# Patient Record
Sex: Male | Born: 1960
Health system: Southern US, Community
[De-identification: ages and names within clinical notes are randomized; demographics above are authoritative.]

## PROBLEM LIST (undated history)

## (undated) DIAGNOSIS — R0600 Dyspnea, unspecified: Secondary | ICD-10-CM

## (undated) DIAGNOSIS — R011 Cardiac murmur, unspecified: Secondary | ICD-10-CM

## (undated) DIAGNOSIS — F419 Anxiety disorder, unspecified: Secondary | ICD-10-CM

## (undated) DIAGNOSIS — R61 Generalized hyperhidrosis: Secondary | ICD-10-CM

## (undated) DIAGNOSIS — F32A Depression, unspecified: Secondary | ICD-10-CM

## (undated) DIAGNOSIS — Z87442 Personal history of urinary calculi: Secondary | ICD-10-CM

## (undated) DIAGNOSIS — T8859XA Other complications of anesthesia, initial encounter: Secondary | ICD-10-CM

## (undated) DIAGNOSIS — D649 Anemia, unspecified: Secondary | ICD-10-CM

## (undated) DIAGNOSIS — K219 Gastro-esophageal reflux disease without esophagitis: Secondary | ICD-10-CM

## (undated) DIAGNOSIS — M1990 Unspecified osteoarthritis, unspecified site: Secondary | ICD-10-CM

## (undated) DIAGNOSIS — E7800 Pure hypercholesterolemia, unspecified: Secondary | ICD-10-CM

## (undated) DIAGNOSIS — N200 Calculus of kidney: Secondary | ICD-10-CM

## (undated) DIAGNOSIS — I10 Essential (primary) hypertension: Secondary | ICD-10-CM

## (undated) DIAGNOSIS — F988 Other specified behavioral and emotional disorders with onset usually occurring in childhood and adolescence: Secondary | ICD-10-CM

## (undated) HISTORY — DX: Other specified behavioral and emotional disorders with onset usually occurring in childhood and adolescence: F98.8

## (undated) HISTORY — PX: KIDNEY STONE SURGERY: SHX686

## (undated) HISTORY — PX: HERNIA REPAIR: SHX51

## (undated) HISTORY — PX: REPLACEMENT TOTAL KNEE: SUR1224

## (undated) HISTORY — DX: Generalized hyperhidrosis: R61

## (undated) HISTORY — PX: BILATERAL CARPAL TUNNEL RELEASE: SHX6508

## (undated) HISTORY — PX: SPINE SURGERY: SHX786

## (undated) HISTORY — PX: BACK SURGERY: SHX140

## (undated) HISTORY — PX: MEDIAL PARTIAL KNEE REPLACEMENT: SHX5965

## (undated) HISTORY — PX: LUMBAR FUSION: SHX111

## (undated) HISTORY — DX: Calculus of kidney: N20.0

## (undated) HISTORY — PX: KNEE SURGERY: SHX244

## (undated) HISTORY — DX: Essential (primary) hypertension: I10

## (undated) HISTORY — DX: Pure hypercholesterolemia, unspecified: E78.00

---

## 1998-03-13 ENCOUNTER — Inpatient Hospital Stay (HOSPITAL_COMMUNITY): Admission: RE | Admit: 1998-03-13 | Discharge: 1998-03-19 | Payer: Self-pay | Admitting: Orthopedic Surgery

## 1998-03-21 ENCOUNTER — Other Ambulatory Visit: Admission: RE | Admit: 1998-03-21 | Discharge: 1998-03-21 | Payer: Self-pay

## 2007-10-05 ENCOUNTER — Inpatient Hospital Stay (HOSPITAL_COMMUNITY): Admission: RE | Admit: 2007-10-05 | Discharge: 2007-10-09 | Payer: Self-pay | Admitting: Orthopedic Surgery

## 2007-12-24 ENCOUNTER — Encounter: Admission: RE | Admit: 2007-12-24 | Discharge: 2007-12-24 | Payer: Self-pay | Admitting: Neurosurgery

## 2009-04-05 IMAGING — CR DG CHEST 2V
2 series · 2 of 2 positions shown · non-contrast
Comparison: None.

CLINICAL DATA: Preop for knee surgery.
 CHEST - 2 VIEW:

[w chest pa *]
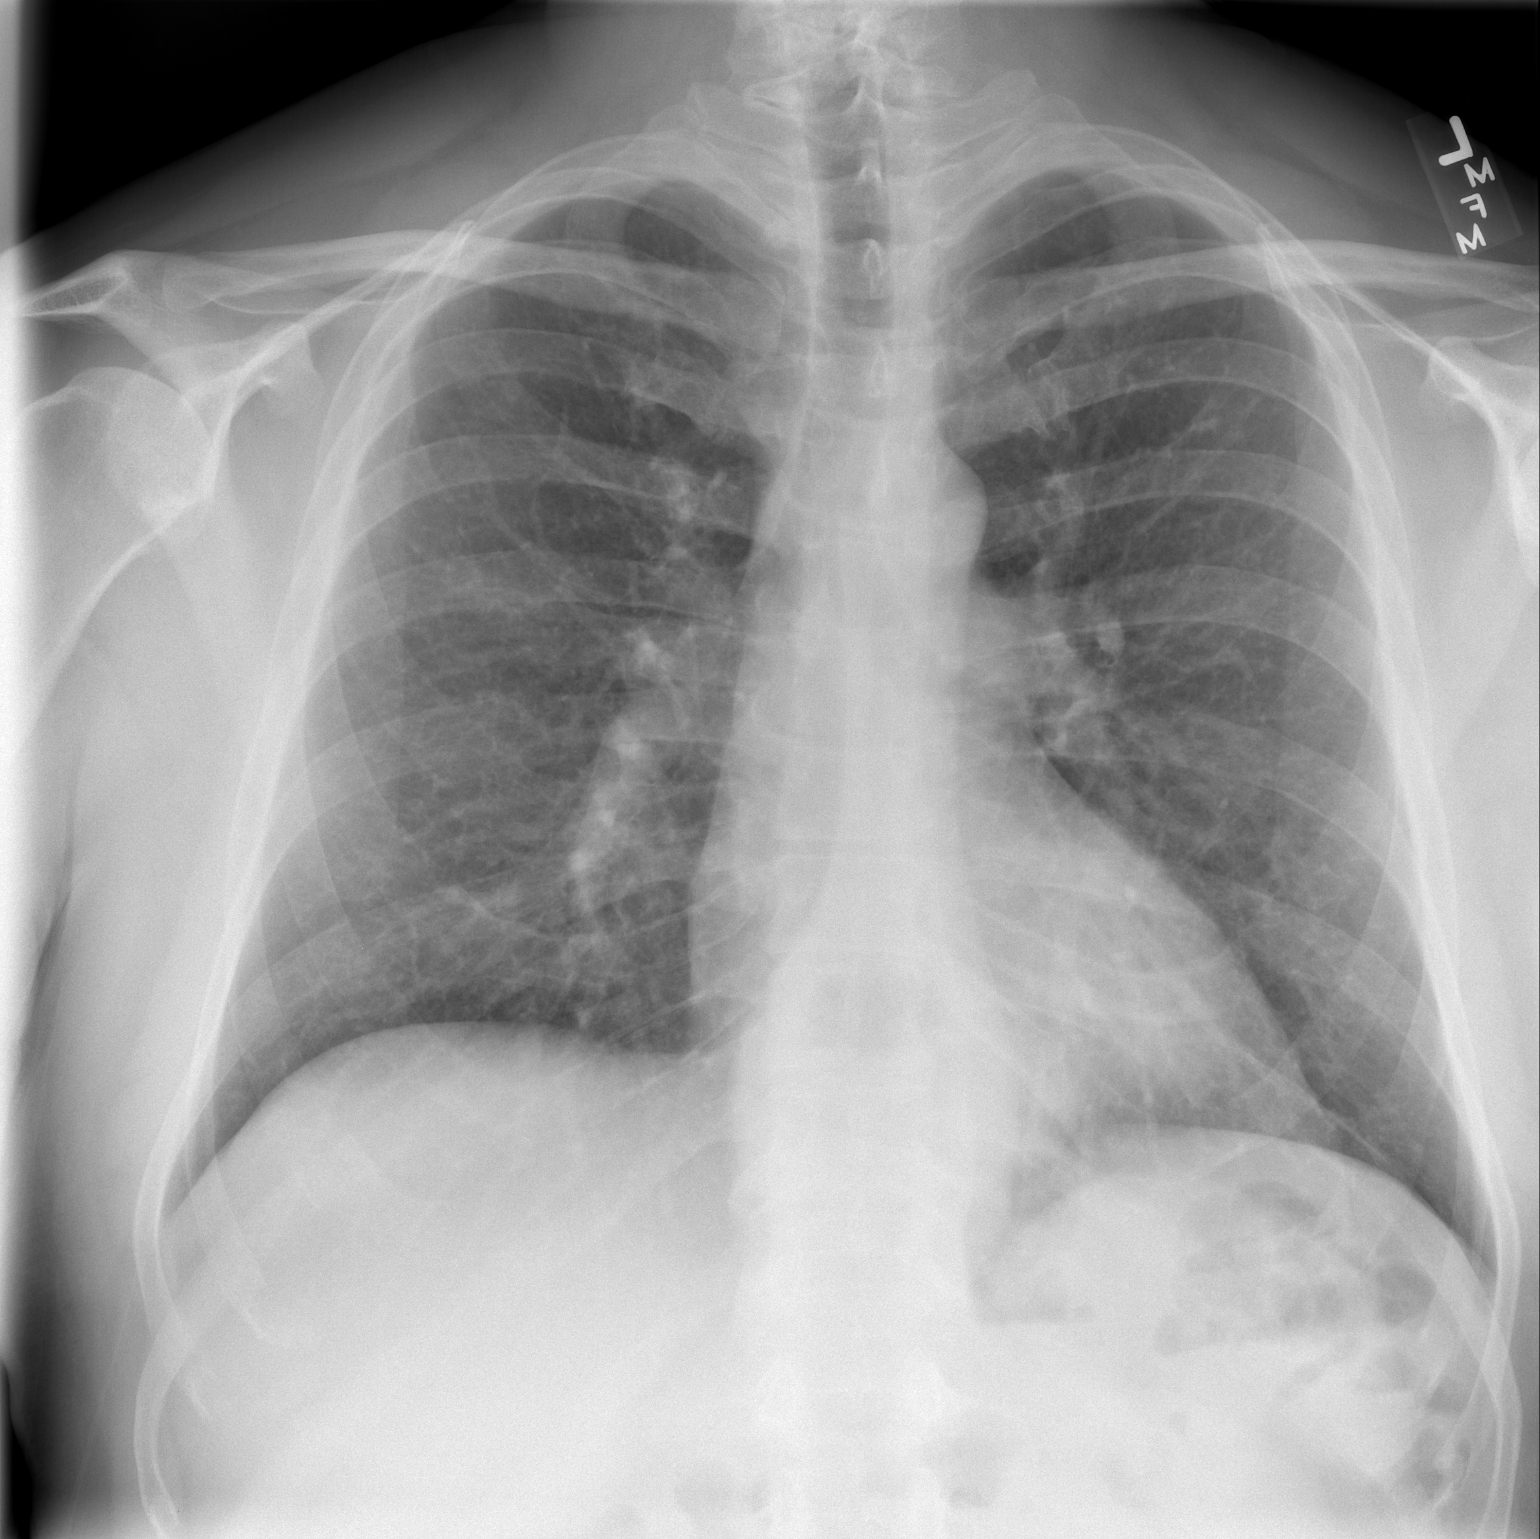

[w chest lat *]
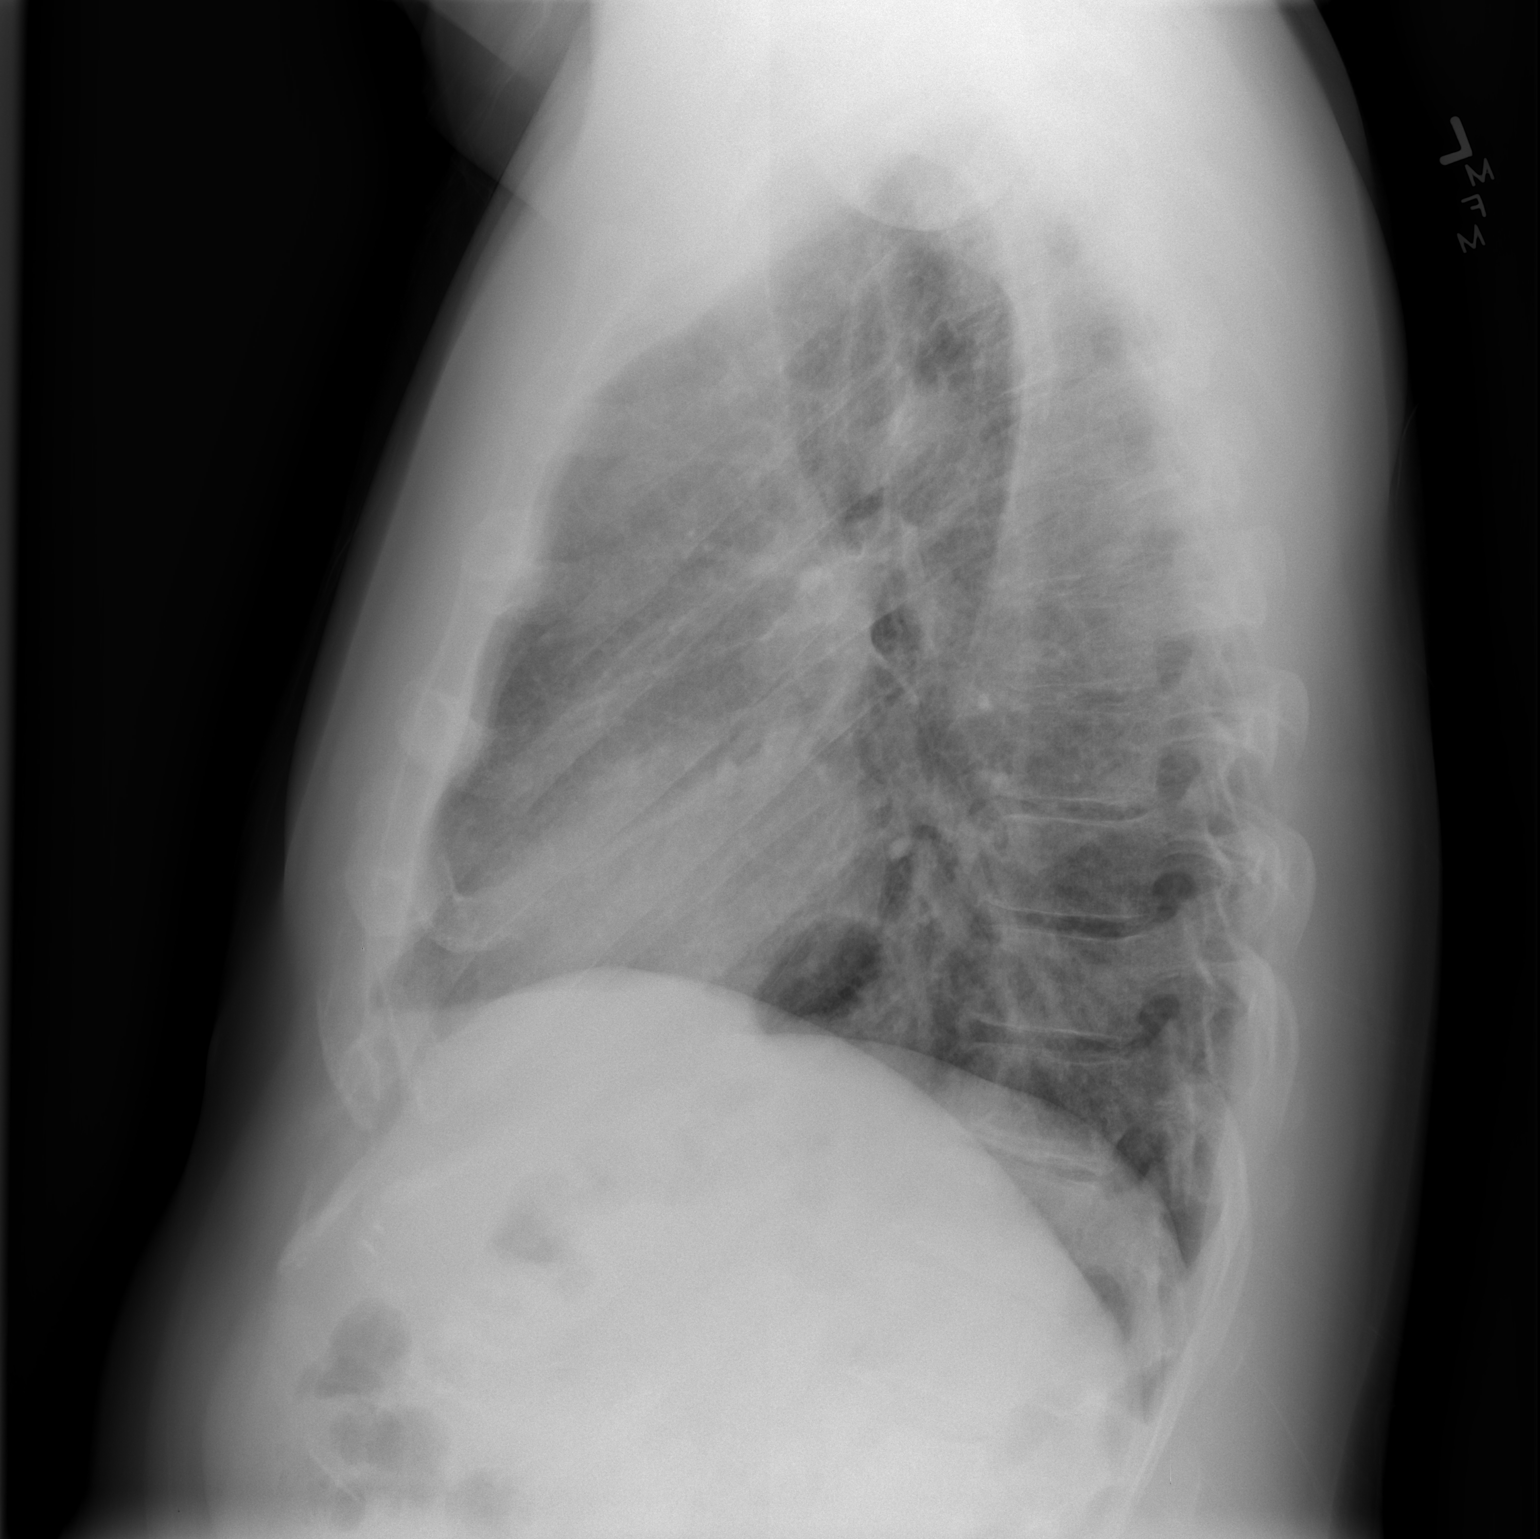

[2 of 2 positions shown; findings below may reference images not displayed]

FINDINGS: Heart and mediastinal contours normal.  There is peribronchial thickening without active airspace disease.  A moderate sized hiatal hernia is noted.
IMPRESSION: Peribronchial thickening and hiatal hernia ? no active disease.

## 2009-05-03 ENCOUNTER — Ambulatory Visit: Payer: Self-pay | Admitting: Vascular Surgery

## 2010-10-19 ENCOUNTER — Encounter (HOSPITAL_COMMUNITY)
Admission: RE | Admit: 2010-10-19 | Discharge: 2010-10-19 | Disposition: A | Discharge status: Home / Self Care | Payer: Medicare Other | Source: Ambulatory Visit | Attending: Neurosurgery | Admitting: Neurosurgery

## 2010-10-19 DIAGNOSIS — Z01810 Encounter for preprocedural cardiovascular examination: Secondary | ICD-10-CM | POA: Insufficient documentation

## 2010-10-19 DIAGNOSIS — Z01812 Encounter for preprocedural laboratory examination: Secondary | ICD-10-CM | POA: Insufficient documentation

## 2010-10-19 LAB — SURGICAL PCR SCREEN: MRSA, PCR: NEGATIVE

## 2010-10-19 LAB — CBC
HCT: 45.1 % (ref 39.0–52.0)
Hemoglobin: 15.3 g/dL (ref 13.0–17.0)
MCH: 32.6 pg (ref 26.0–34.0)
MCHC: 33.9 g/dL (ref 30.0–36.0)
MCV: 96 fL (ref 78.0–100.0)
Platelets: 280 10*3/uL (ref 150–400)
RBC: 4.7 MIL/uL (ref 4.22–5.81)
RDW: 12.8 % (ref 11.5–15.5)
WBC: 7.7 10*3/uL (ref 4.0–10.5)

## 2010-10-25 ENCOUNTER — Inpatient Hospital Stay (HOSPITAL_COMMUNITY): Payer: Medicare Other

## 2010-10-25 ENCOUNTER — Inpatient Hospital Stay (HOSPITAL_COMMUNITY)
Admission: RE | Admit: 2010-10-25 | Discharge: 2010-10-31 | DRG: 460 | Disposition: A | Discharge status: Home Health | Payer: Medicare Other | Source: Ambulatory Visit | Attending: Neurosurgery | Admitting: Neurosurgery

## 2010-10-25 DIAGNOSIS — K5900 Constipation, unspecified: Secondary | ICD-10-CM | POA: Diagnosis present

## 2010-10-25 DIAGNOSIS — Z96659 Presence of unspecified artificial knee joint: Secondary | ICD-10-CM

## 2010-10-25 DIAGNOSIS — M47817 Spondylosis without myelopathy or radiculopathy, lumbosacral region: Principal | ICD-10-CM | POA: Diagnosis present

## 2010-10-25 DIAGNOSIS — M25569 Pain in unspecified knee: Secondary | ICD-10-CM | POA: Diagnosis present

## 2010-10-25 DIAGNOSIS — G894 Chronic pain syndrome: Secondary | ICD-10-CM | POA: Diagnosis present

## 2010-10-25 LAB — ABO/RH: ABO/RH(D): O NEG

## 2010-10-25 LAB — TYPE AND SCREEN
ABO/RH(D): O NEG
Antibody Screen: NEGATIVE

## 2010-11-01 NOTE — Op Note (Signed)
NAMEJAZZIEL, Charles Lara                ACCOUNT NO.:  [id number]  MEDICAL RECORD NO.:  [id number]           PATIENT TYPE:  I  LOCATION:  3036                         FACILITY:  [facility]  PHYSICIAN:  Hewitt Shorts, M.D.DATE OF BIRTH:  1960/09/27  DATE OF PROCEDURE:  10/25/2010 DATE OF DISCHARGE:                              OPERATIVE REPORT   PREOPERATIVE DIAGNOSES: 1. Grade 2 dynamic degenerative spondylosis at L4-5. 2. Severe multifactorial lumbar stenosis at L4-5. 3. Advanced degenerative disk disease and spondylosis at L4-5 and L5-     S1.  POSTOPERATIVE DIAGNOSES: 1. Grade 2 dynamic degenerative spondylosis at L4-5. 2. Severe multifactorial lumbar stenosis at L4-5. 3. Advanced degenerative disk disease and spondylosis at L4-5 and L5-     S1.  PROCEDURES: 1. Bilateral L4-5 and L5-S1 lumbar decompression including bilateral     laminotomy, facetectomy, foraminotomy with decompression of the     exiting L4, L5, and S1 nerve roots bilaterally with decompression     beyond that required for interbody arthrodesis. 2. Bilateral L4-5 and L5-S1 posterior lumbar interbody arthrodesis     with AVS PEEK interbody implants infuse and Vitoss with bone marrow     aspirate. 3. Bilateral L4-S1 posterolateral arthrodesis with posterior     instrumentation infuse and Vitoss with bone marrow aspirate.  SURGEON:  Hewitt Shorts, MD  ASSISTANT:  Cristi Loron, MD  ANESTHESIA:  General endotracheal.  INDICATIONS:  The patient is a 50 year old man, who presented with low back pain extending from the buttocks and thighs bilaterally, who has advanced degenerative change in the lumbar spine.  MRI from 2012 as compared to 2009 showed progression of the spondylolisthesis, worsening of the stenosis, and worsening of the overall spondylotic degeneration. Decision was made to proceed with two-level decompression arthrodesis.  PROCEDURE IN DETAIL:  The patient was brought to the operating  room, placed on general endotracheal anesthesia.  The patient was turned to prone position.  Lumbar region was prepped with Betadine soap and solution, draped in sterile fashion.  The midline was infiltrated with local anesthetic with epinephrine.  An x-ray taken of the L4-S1 level and identified the midline.  Incision was made and carried down through the subcutaneous tissue.  Bipolar electrocautery was used to maintain hemostasis.  Dissection was carried down to the lumbar fascia, which was incised bilaterally and the paraspinal muscles were dissected from the spinous process and lamina in subperiosteal fashion.  A self-retaining retractor was placed and x-ray taken, and L4-5 and L5-S1 interlaminar spaces were identified.  Then using magnification and microdissection microsurgical technique, we proceeded with the decompression.  There was hypertrophic facet arthropathy bilaterally L4-5 worse than the L5-S1. We proceeded with bilateral laminotomies and facetectomies at both the L4-5 and L5-S1 levels using the high-speed drill and double-action rongeurs and Kerrison punches.  The ligamentum flavum was carefully removed at each laminotomy and facetectomy site and foraminotomies performed bilaterally so as to decompress the exiting L4, L5, and S1 nerve roots bilaterally.  Once the decompression was completed, we identified the annulus.  The listhesis at L4-5 was noted and then proceeded with a bilateral  diskectomy at each level.  The disk space at L5-S1 was severely narrowed.  The overlying annulus and bulging disk was removed and then we gradually entered into the disk space and proceeded with diskectomy removing the small amount of remaining degenerative disk material using micro curettes and pituitary rongeurs.  We then began to prepare the endplates of the vertebral bodies for interbody arthrodesis using paddle curettes to remove the cartilaginous surface down to a good bony  surface.  Similarly, the L4-5 diskectomy was performed using micro curettes and pituitary rongeurs.  Thorough diskectomy was performed medially and laterally and some of the heaped-up spondylotic disk protrusion was removed as well so as to decompress the ventral aspect of the thecal sac.  We again prepared the endplates using paddle curettes removing the cartilaginous endplate surfaces down to a good bony surface.  We then measured the height of the intervertebral disk space and selected 7-mm implants for the L5-S1 level and 9-mm implants for the L4- 5 level.  A C-arm fluoroscope was then draped and brought in to the field to provide guidance and probing of the pedicles.  I first probed the left L5 pedicle.  Bone marrow was aspirated from the vertebral body.  I injected over 10 mL strips of Vitoss.  We then packed each of the implants with combination of Vitoss infuse with the infuse being sandwiching the middle of the implants.  We then first placed the implants on the L5-S1 level, placing the first implant on the patient's right side.  The thecal sac and nerve root was retracted medially.  Once that implant was in place, we then packed the midline of the disk space with infuse and Vitoss, bone marrow aspirate, then placed a second implant on the left side again and retracted thecal sac and nerve root medially.  Once both implants were countersunk, we then proceeded to the L4-5 level and again the first implant was placed on the right side.  We then packed the midline with combination of infuse and Vitoss bone marrow aspirate and placed a second implant on the left side using a similar technique as at the L5-S1 level.  We then packed additional Vitoss bone marrow aspirate lateral to the implants at the L4-5 level in the lateral aspect of the disk space.  We then proceeded with identifying pedicle entry point on the right- sided L5.  Both pedicles were probed, tapped, examined with  a ball probe.  Good threading was noted.  No bony cutouts were found and then we placed 5.75 x 45 mm screws bilaterally.  Similarly, using C-arm fluoroscopic guidance, pedicle entry points were identified in the L4 bilaterally.  Each of the pedicles was probed, tapped, examined the ball probe.  Good bony surfaces noted.  No cutouts were found.  We placed 5.75 x 45 mm screws bilaterally and again at S1, each of the pedicle entry points were identified.  Each of the pedicles were probed, tapped, examined with the ball probe with good bony surfaces noted and then we placed a 5.75 x 40 mm screws bilaterally.  Once all six screws were in place, we selected two 60-mm pre-lordosed rods.  They were placed within the screw heads and then secured with locking caps.  Once all 6 locking caps were placed, final tightening was performed against a counter torque.  We previously decorticated the hypertrophic facet surfaces at L4-5 and L5-S1 bilaterally with the high-speed drill.  We packed a combination of infuse and Vitoss bone marrow  aspirate across the facet complexes bilaterally at L4-5 and L5-S1.  The wound was irrigated numerous times through the procedure, flushed with saline, subsequently with bacitracin solution.  Good hemostasis was established with use of bipolar electrocautery and Gelfoam with thrombin, and then once the decompression was completed, the arthrodesis completed, and hemostasis was established, we proceeded with closure. Deep fascia was closed with interrupted undyed 1 Vicryl sutures, Scarpa fascia with interrupted undyed 1 Vicryl sutures.  Subcutaneous and Subcuticular were closed with interrupted, inverted 2-0 undyed Vicryl sutures.  Skin was closed with surgical staples.  The wound was dressed with Adaptic, sterile gauze, and 4-inch Hypafix.  The procedure was tolerated well.  The estimated blood loss was 800 mL.  We did use a Cell Saver and we were able to return 320 mL  of Cell Saver blood to the patient.  Sponge and needle count were correct.  Following surgery, the patient was turned back to supine position, reversed from the anesthetic, extubated, and transferred to the recovery room for further care where he was noted to be moving all four extremities to command.     Hewitt Shorts, M.D.     RWN/MEDQ  D:  10/25/2010  T:  10/26/2010  Job:  [id number]  Electronically Signed by Shirlean Kelly M.D. on 11/01/2010 07:58:23 AM

## 2010-11-01 NOTE — Discharge Summary (Signed)
  NAMEZYMIRE, TURNBO NO.:  [id number]  MEDICAL RECORD NO.:  [id number]           PATIENT TYPE:  I  LOCATION:  3036                         FACILITY:  [facility]  PHYSICIAN:  Hewitt Shorts, M.D.DATE OF BIRTH:  July 20, 1961  DATE OF ADMISSION:  10/25/2010 DATE OF DISCHARGE:  10/31/2010                              DISCHARGE SUMMARY   ADMISSION HISTORY AND PHYSICAL EXAMINATION:  The patient is a 50 year old man who presented with low back pain extending into the buttocks bilaterally, left worse than right.  He had chronic pain for 7-10 years treated with numerous medications and spinal injections.  X-ray showed a dynamic degenerative grade 2 spondylolisthesis at L4-5 with disk space narrowing, L5-S1 worse than L4-5.  MRI reconfirmed the degenerative spondylolisthesis, which appeared worse than his previous MRI from 3 years earlier.  There was severe stenosis at the L4-5 level.  Previous surgery included a number of knee surgeries as well as umbilical herniorrhaphy.  General examination showed temperature 98.2, pulse of 80, blood pressure 143/86.  Lungs were clear.  Heart was unremarkable. Neurologic examination showed weakness of the left evertor.  Further details of the admission history and physical examination included in my admission dictation.  HOSPITAL COURSE:  The patient was admitted, underwent an L4-5 and L5-S1 lumbar decompression plus PLA.  He had a lot of difficulty with postoperative pain because of long term chronic narcotics prescribed by his orthopedist and pain management physician.  Physical therapy and occupational therapy consultations were requested and gradually over the postoperative days, he has become more comfortable.  We have adjusted his medications.  Currently, he continues on his morphine 60 mg p.o. three times a day as well as Percocet 1 or 2 tablets q.4 h. p.r.n. pain. He also is using Robaxin 750 mg twice a day and that seems to  be the most effective muscle relaxant for him.  He has been able to steadily increase his mobility, ability to transfer, ambulate in the hallways, and his wound is healing well and his staples were removed yesterday. There is no erythema, swelling, or drainage.  He is to return for followup in my office in about 3-4 weeks with AP and lateral lumbar spine x-rays.  He is to be walking daily out in the fresh air.  He is to be wearing his lumbar brace whenever he is up and about.  Home health, PT, and OT have been ordered, three in one has been requested. Discharge prescriptions were given for Percocet 1 or 2 tablets p.o. q.4- 6 h. p.r.n. pain, 100 tablets, no refills and Robaxin 750 mg b.i.d. p.r.n. muscle spasms, 60 tablets, 1 refill.  DISCHARGE DIAGNOSES:  Lumbar stenosis, lumbar spondylolisthesis, lumbar spondylosis, lumbar degenerative disk disease.     Hewitt Shorts, M.D.     RWN/MEDQ  D:  10/31/2010  T:  11/01/2010  Job:  [id number]  Electronically Signed by Shirlean Kelly M.D. on 11/01/2010 07:58:30 AM

## 2010-11-01 NOTE — Op Note (Signed)
Charles Lara, Charles Lara NO.:  [id number]  MEDICAL RECORD NO.:  [id number]           PATIENT TYPE:  I  LOCATION:  3172                         FACILITY:  [facility]  PHYSICIAN:  Hewitt Shorts, M.D.DATE OF BIRTH:  Mar 16, 1961  DATE OF PROCEDURE: DATE OF DISCHARGE:                              OPERATIVE REPORT   HISTORY OF PRESENT ILLNESS:  The patient is a 50 year old right-handed white male who was evaluated for low back pain extending down to the upper buttocks bilaterally, left worse than right.  He has had chronic pain for 7-10 years.  He has been treated with NSAIDS, muscle relaxants, and spinal injections, none of which has given him lasting relief.  Dr. Sheran Luz has been managing his chronic pain with extensive medication management.  He also has chronic degeneration of his knees bilaterally with chronic bilateral knee pain.  He has had number of previous knee surgeries as described below.  The patient has been studied with x-rays and MRI scan.  X-rays show a dynamic degenerative grade 2 spondylolisthesis of L4 and L5 and disk space down to L5-S1 worse than L4-5.  MRI scan reconfirmed the degenerative spondylolisthesis and disk space narrowing.  There has been increased anterolisthesis at L4 and L5 on the most recent MRI as compared to previous study done nearly 3 years earlier, and worsening of the severe multifactorial lumbar stenosis at L4-5 and increased broad- based disk bulge at the L5-S1 level, but without significant canal stenosis.  The patient is admitted now for L4-5 and L5-S1 lumbar decompression and stabilization.  He also has minimal weakness and atrophy in the evertors of the left foot.  He was evaluated by Dr. Lesia Sago, was found to have both S1 radiculopathy bilaterally and peroneal neuropathy.  He also has bilateral carpal tunnel syndrome.  PAST MEDICAL HISTORY:  No history of hypertension, myocardial function, cancer,  stroke, diabetes, peptic ulcer disease, or lung disease.  PREVIOUS SURGERIES:  Left knee unicompartmental knee replacement in 1999, right unicompartmental knee replacement in 2000, right total knee replacement in 2010, and umbilical herniorrhaphy in 1980.  He denies allergy to medications.  CURRENT MEDICATIONS: 1. Prozac 60 mg daily. 2. Bupropion SR 200 mg daily. 3. Adderall 20 mg b.i.d. 4. Diclofenac 75 mg p.o. b.i.d. 5. Flexeril 10 mg t.i.d. 6. Morphine sulfate 60 mg t.i.d. 7. Testosterone injection every other week. 8. Osteo Bi-Flex.  FAMILY HISTORY:  His father died in a motor vehicle accident.  Mother is in good health.  SOCIAL HISTORY:  The patient is on disability.  He is divorced.  His girlfriend is his ex-wife.  He smokes half a pack a day and he has been smoking for 35 years.  He does not drink alcoholic beverages currently and has not for 8 years.  He has a history of cocaine and ethanol abuse, underwent rehabilitation in 1994 and has been clean according to the patient.  REVIEW OF SYSTEMS:  Notable for that has been described in his history of present illness, past medical history, and the review of systems is otherwise unremarkable.  PHYSICAL EXAMINATION:  GENERAL:  The  patient is a well-developed, well- nourished white male, in no distress. VITAL SIGNS:  Temperature 98.2, pulse 80, blood pressure 143/86, respiratory rate 20, height is 63, weight is 225 pounds.  LUNGS:  Clear to auscultation and symmetric respiratory excursion. HEART:  Regular rate and rhythm with S1 and S2.  There is no murmur. EXTREMITIES:  Rubor, worse on the left than the right leg, but no cyanosis, clubbing, or edema. MUSCULOSKELETAL:  Tenderness in the left paralumbar region, none over the right paralumbar region, none over the lumbar spinous process.  He is able to flex to 90 degrees, able to extend to 10 degrees.  Straight leg raising is negative bilaterally. VASCULAR:  Pulses in the  feet are 1+ bilaterally although the feet are somewhat cold to touch. NEUROLOGIC:  Iliopsoas and quadriceps are 5 bilaterally.  The left dorsiflexor and plantarflexor are 5, but the left evertor is 1/5 and the left invertor is 5/5.  The right dorsiflexor, extensor hallucis longus, evertor, invertor, and plantar flexor are all 5/5.  Sensation is intact to pinprick in the legs and feet bilaterally.  Reflexes are 1 at the quadriceps, trace at the gastrocnemius are symmetrical bilaterally. Toes are downgoing bilaterally.  He has a normal gait and stance.  IMPRESSION:  Chronic pain syndrome due to both chronic back and knee pain, advanced degenerative changes in the lumbar spine including dynamic degenerative grade 2 spondylolisthesis at L4 and L5, severe multifactorial lumbar stenosis at L4-5, marked degenerative disk disease with loss of disk space and height at L5-S1 with broad-based disk protrusion.  PLAN:  The patient to be admitted for decompression arthrodesis at the L4-5 and L5-S1 levels including bilateral L4-5 and L5-S1 laminectomy, facetectomy, foraminotomy, bilateral L4-5 and L5-S1 posterior lumbar interbody fusion, posterolateral arthrodesis with interbody implants, bone graft, and posterior instrumentation with bone graft.  We discussed the extensive nature of the surgery and the likelihood of difficult postoperative pain management due to his long-term narcotic use as well as postoperative immobilization with lumbar brace.  We discussed the risks including the risk of infection, bleeding, possible need for transfusion, risk of nerve dysfunction with pain, weakness, numbness, or paresthesias, the risk of dural tear, CSF leakage, possible need for further surgery, risk of failure of the arthrodesis, anesthetic risks and myocardial function, stroke, pneumonia, and death.  Many of these risks are increased due to a significant smoking habit and we have encouraged him to quit  smoking.  After discussing this thoroughly, the patient does wish to go ahead with surgery and is now admitted for such.     Hewitt Shorts, M.D.     RWN/MEDQ  D:  10/25/2010  T:  10/25/2010  Job:  [id number]  Electronically Signed by Shirlean Kelly M.D. on 11/01/2010 07:58:06 AM

## 2011-01-15 NOTE — Op Note (Signed)
NAMESMARAN, GAUS NO.:  [id number]   MEDICAL RECORD NO.:  [id number]          PATIENT TYPE:  INP   LOCATION:  0009                         [facility]ACILITY:  [facility]   PHYSICIAN:  Ollen Gross, M.D.    DATE OF BIRTH:  1961/01/18   DATE OF PROCEDURE:  10/05/2007  DATE OF DISCHARGE:                               OPERATIVE REPORT   PREOPERATIVE DIAGNOSIS:  Failed right knee unicompartmental replacement.   POSTOPERATIVE DIAGNOSIS:  Failed right knee unicompartmental  replacement.   PROCEDURE:  Revision of right knee unicompartmental total knee  arthroplasty.   SURGEON:  Ollen Gross, M.D.   ASSISTANT:  Avel Peace, PA-C   ANESTHESIA:  General with postop epidural.   ESTIMATED BLOOD LOSS:  Minimal.   DRAINS:  Hemovac x1.   COMPLICATIONS:  None.   TOURNIQUET TIME:  Fifty-four minutes at 300 mmHg.   CONDITION:  Stable to Recovery.   BRIEF CLINICAL NOTE:  Charles Lara is a 50 year old male who has a severely  painful right knee.  He had a right knee unicompartmental arthroplasty  done approximately 8-9 years ago.  He has had progressively worsening  pain and the knee.  He had a cortisone injection which helped  temporarily.  His x-ray show collapse of the compartmental replacement  with degeneration of the other compartments.  He presents now for  revision to a total knee arthroplasty.   PROCEDURE IN DETAIL:  After the successful administration of general  anesthetic, a tourniquet is placed high on the right thigh and the right  lower extremity is prepped and draped in the usual sterile fashion.  Extremity was wrapped in Esmarch, knee flexed and tourniquet inflated to  300 mmHg.  I incorporated his old incision to a medial parapatellar  incision.  The skin was cut with a 10 blade through subcutaneous tissue  to the level of the extensor mechanism.  A fresh blade was used to make  a medial parapatellar arthrotomy.  Soft tissue over the proximal medial  tibia is  subperiosteally elevated to the joint line with the knife and  into the semimembranosus bursa with a Cobb elevator.  Soft tissue  laterally is elevated with attention being paid to avoid the patellar  tendon on the tibial tubercle.  The patella is subluxed laterally, knee  flexed 90 degrees, and ACL and PCL were removed.  He has bone-on-bone  change in the lateral and patellofemoral compartments at this time.  He  easily removed the femoral component from the bone.  It was in good  position, but was easily removed.  I then used a drill to create a  starting hole in the distal femur.  The canal was thoroughly irrigated  and the 5-degree right valgus alignment guide was placed.  We removed  approximately 11 mm of the distal femur; I took 11 because of the preop  flexion contracture.  Distal femoral resection is made with an  oscillating saw.  Sizing block is placed; size 4 is the most  appropriate.  The rotation is marked off the epicondylar axis.  The size  4 cutting  block is placed and the anterior, posterior and chamfer cuts  are made.   The tibia subluxed forward and the lateral meniscus removed.  The medial  meniscus is already gone.  The medial polyethylene tray is sunk way down  into the bone.  Next, an extramedullary cutting guide is placed and we  referenced proximally at the medial aspect of the tibial tubercle and  distally along the second metatarsal axis of the tibial crest.  The  block is pinned to remove approximately 10 mm from the less deficient  lateral side.  Tibial resection is made with an oscillating saw.  This  got just about to the base of where the tibial polyethylene was.  I went  down an additional 2 mm to get to the base.  The tibial polyethylene was  easily removed.  There was a small cyst present around where the cement  was and I evacuated the contents of this cyst after removing the cement.  The tibial bone surfaces looked good.  The size 5 is the most   appropriate tibial tray and that we prepared with the proximal reamer  for the MBT revision tray and then the keel punch.  The femoral  preparation is then completed with the intercondylar cut for the size 4.   A size 5 MBT revision trial, a size 4 posterior-stabilized femoral trial  and then I started with a 15-mm posterior-stabilized rotating platform  insert trial.  I had to go up to 20 to get good varus-valgus balance and  AP balance.  I thus added 10-mm augments to the tibial tray both medial  and lateral.  I was then able to accomplish this same balance with a 10-  mm insert.  The patella was then everted and the thickness measured to  be at 28 mm.  Freehand resection is taken to 16 mm, a 41 template is  placed, lug holes are drilled, trial patella is placed and it tracks  normally.  Osteophytes are removed off the posterior femur with the  trial in place.  All trials are removed and the cut bone surfaces are  prepared with pulsatile lavage.  Cement is mixed and once ready for  implantation, the size 5 MBT revision tray with 10-mm medial and lateral  augment and the size 4 posterior-stabilized femur cemented into place.  Patella, size 41, is also cemented and held with a clamp.  Trial 10-mm  inserts are placed and knee held in full extension and all extruded  cement removed.  When the cement is fully hardened, then the trials  removed and the FloSeal injected on the posterior capsule.  The  permanent 10-mm posterior-stabilized rotating platform insert is placed  into the tibial tray.  FloSeal is injected in the medial and lateral  gutters in the suprapatellar area.  Moist sponge is placed and  tourniquet is released with a total time of 54 minutes.  Sponges is held  for 2 minutes and removed.  Mild bleeding is stopped with cautery.  We  irrigated again and closed the arthrotomy over a Hemovac drain with  interrupted #1 PDS.  Flexion against gravity is about 120 degrees.  Subcu is  closed with interrupted 2-0 Vicryl and subcuticular running 4-0  Monocryl.  The incision is cleaned and dried and Steri-Strips and a  bulky sterile dressing applied.  He is then placed into a knee  immobilizer, awakened and transported to Recovery in stable condition.  Please note that his epidural catheter  had been placed in the preop  holding area prior to coming into the operating room.      Ollen Gross, M.D.  Electronically Signed     FA/MEDQ  D:  10/05/2007  T:  10/06/2007  Job:  [id number]
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
## 2011-01-18 NOTE — Discharge Summary (Signed)
[name], [name]                ACCOUNT NO.:  [id number]   MEDICAL RECORD NO.:  [id number]          PATIENT TYPE:  INP   LOCATION:  1619                         FACILITY:  [facility]   PHYSICIAN:  Alexzandrew L. Perkins, P.A.C.DATE OF BIRTH:  Jan 20, 1961   DATE OF ADMISSION:  10/05/2007  DATE OF DISCHARGE:  10/09/2007                               DISCHARGE SUMMARY   ADMITTING DIAGNOSES:  1. Failed right unicompartmental placement ongoing current      osteoarthritis right knee.  2. History of alcoholism.  3. History of drug use abuse.  4. Situational anxiety.  5. Attention deficit disorder.  6. Hemorrhoids.  7. History of renal calculi.  8. Severe urinary tract infection.  9. Degenerative disk disease.  10.Herpes simplex disease.  11.Granuloma annulare.   DISCHARGE DIAGNOSES:  1. Failed right unicompartmental replacement status post revision      right uni over to a total knee arthroplasty  2. Postop blood loss anemia.  3. History of alcoholism.  4. History of drug use abuse.  5. Situational anxiety.  6. Attention deficit disorder.  7. Hemorrhoids.  8. History of renal calculi.  9. Severe urinary tract infection.  10.Degenerative disk disease.  11.Herpes simplex disease.  12.Granuloma annulare.   PROCEDURE:  October 05, 2007 revision right unicompartmental over to a  total knee arthroplasty.  Surgeon Dr. Lequita Halt, assisted Avel Peace PA-  C.   ANESTHESIA:  General.   CONSULTS:  None.   BRIEF HISTORY:  Charles Lara is a 46-year male with severe right knee pain, had a  right uni knee done approximately 8-9 years ago, progressive worsening  pain and dysfunction.  Cortisone injection held temporarily.  X-ray  showed collapse of the compartmental replacement and now he presents for  total knee.   LABORATORY DATA:  Preop CBC showed hemoglobin of 14.7, hematocrit 42.3,  white cell count 9.8, postop hemoglobin 11.9, drifted down to 9.9, last  H&H 9.7 and 27.6.  PT/PTT preop 11.6  and 27, respectively.  INR 0.8.  Serial protimes followed.  PT/INR 14.0 and 1.1.  Chem panel on admission  elevated ALT of 58.  Remaining Chem panel within normal limits.  Preop  serial B-mets were followed.  Electrolytes remained within normal  limits.  Preop UA negative.  Blood type O negative.  EKG October 01, 2007, normal sinus rhythm, normal EKG, confirmed by Dr. Bonnee Quin.  Two-view chest October 01, 2007, peribronchial thickening, hiatal hernia  with no evidence of active disease.   HOSPITAL COURSE:  The patient was admitted to [facility], tolerated  procedure well.  Transferred to the recovery room on the orthopedic  floor.  Started on PCA and p.o. analgesic pain control  following__________.  Following surgery, had a rough night, moderate  pain.  Encouraged p.m. medications, actually doing a little bit better  with pain control using oral medications.  Decent urinary output.  Started to get up out of bed with therapy by day two.  Had some  stiffness from the being in bed.  Encouraged mobility discontinued the  PCA and encouraged p.o. medications.  The patient  did pretty well on day  #2, started getting up and walking about 170 feet.  Continued to  progress well and by day #3 still having fair amount of pain.  Started  him on Lovenox for DVT prophylaxis.  INR was coming up a little slow.  It was only 1.1.  Kept him one more day, and by the following day postop  day #4, he was meeting his goals, ready to go home.  We decided to take  him home on Lovenox since his INR was, not quite therapeutic.   DISCHARGE/PLAN:  1. Plan to discharge home on October 09, 2007.  2. Discharge diagnoses, please see above.  3. Discharge medications:  OxyContin, Mepergan Fortes, Flexeril,      Coumadin and Lovenox.   DISCHARGE INSTRUCTIONS:  1. Diet as tolerated.  2. Activity:  Weightbearing as tolerated right lower extremity.  Home      health PT and home health nursing total knee  protocol.  Follow-up 2      weeks.   DISPOSITION:  Home.   CONDITION ON DISCHARGE:  Improving.      Alexzandrew L. Perkins, P.A.C.     ALP/MEDQ  D:  11/10/2007  T:  11/11/2007  Job:  [id number]   cc:   Ollen Gross, M.D.  Fax: [phone]   Quentin Mulling, MD  Fax: [phone]

## 2011-01-18 NOTE — Assessment & Plan Note (Signed)
OFFICE VISIT   Charles Lara, Charles Lara  DOB:  1961/06/29                                       05/03/2009  ZOXWR#:[id number]   HISTORY:  Charles Lara is a 50 year old male referred by Dr. Ethelene Hal for  evaluation of left foot discoloration.  This bluish discoloration of his  left foot has been present for at least 2-3 years.  He states that it  may have been present 4 or 5 years ago.  He has a history of chronic  back pain.  He has previously had a right knee replacement.  He also has  a history of left foot and ankle proprioception deficit.  He has had a  chronically limp in his left leg.  He does not describe claudication or  rest pain type symptoms.   PAST SURGICAL HISTORY:  1. Right knee replacement and redo, as mentioned above.  2. Left knee operation.  3. Umbilical hernia repair.   Atherosclerotic risk factors include smoking one pack a day.  He denies  history of diabetes, hypertension, elevated cholesterol, coronary artery  disease.   FAMILY HISTORY:  Unremarkable.   SOCIAL HISTORY:  He is single and has 2 children.  He is disabled.  Pack  a day smoker, as mentioned above.  He does not consume alcohol  regularly.   REVIEW OF SYSTEMS:  He is 6 foot 3, 235 pounds.  ORTHOPEDIC:  He has multiple arthritis and joint pains.  PSYCHIATRIC:  He has a history of anxiety and depression.  ENT, hematologic, neurologic, vascular, renal, GI, pulmonary, cardiac  review of systems are all negative.   MEDICATIONS:  1. Fluoxetine 60 mg once a day.  2. Adderall 30 mg twice daily.  3. Morphine XR 30 mg 3 times a day.  4. Flexeril 2-3 daily.  5. Diclofenac 75 mg twice a day.   ALLERGIES:  He has no known drug allergies.   PHYSICAL EXAMINATION:  Blood pressure is 127/84 in the left arm, pulse  is 62 and regular.  HEENT:  Unremarkable.  Neck:  Has 2+ carotid pulses  without bruit.  Chest:  Clear to auscultation.  Cardiac:  Regular rate  and rhythm.  Abdomen:  Soft,  nontender, nondistended.  No masses.  Extremities:  He has 2+ brachial, radial, femoral, popliteal, posterior  tibial pulses bilaterally.  He has a 2+ right dorsalis pedis pulse.  He  has absent left dorsalis pedis pulses.  The left foot is slightly bluish  and congested in appearance.  There is some left calf atrophy.  Both  feet feel cool and symmetric bilaterally.   He had bilateral ABIs performed today which showed triphasic waveforms  bilaterally, and ABIs were normal of 1.2 bilaterally.   SUMMARY:  Charles Lara has chronic left foot discoloration.  Most likely  this is related to some autonomic dysfunction possibly secondary to his  back problems.  He does not seem to have any evidence of arterial  occlusive disease and does not have any evidence of venous problems as  well.  I did counsel him today in smoking cessation for several minutes  for his overall cardiovascular health.  However, I do not believe he  needs any intervention or further followup for arterial occlusive  disease in his lower extremities at this point.  He will follow up with  me on an as-needed  basis.   Janetta Hora. Fields, MD  Electronically Signed   CEF/MEDQ  D:  05/10/2009  T:  05/11/2009  Job:  [id number]   cc:   Caralyn Guile. Ethelene Hal, M.D.

## 2011-05-23 LAB — CBC
MCHC: 34.7
MCV: 95.8
RBC: 4.41
RDW: 12.8

## 2011-05-23 LAB — COMPREHENSIVE METABOLIC PANEL
ALT: 58 — ABNORMAL HIGH
Albumin: 3.7
Calcium: 10.1
Glucose, Bld: 110 — ABNORMAL HIGH
Potassium: 4.9
Sodium: 142
Total Protein: 6.7

## 2011-05-23 LAB — ABO/RH: ABO/RH(D): O NEG

## 2011-05-23 LAB — URINALYSIS, ROUTINE W REFLEX MICROSCOPIC
Bilirubin Urine: NEGATIVE
Glucose, UA: NEGATIVE
Ketones, ur: NEGATIVE
Protein, ur: NEGATIVE
pH: 6

## 2011-05-23 LAB — TYPE AND SCREEN
ABO/RH(D): O NEG
Antibody Screen: NEGATIVE

## 2011-05-23 LAB — APTT: aPTT: 27

## 2011-05-23 LAB — PROTIME-INR: Prothrombin Time: 11.6

## 2011-05-24 LAB — BASIC METABOLIC PANEL
BUN: 12
BUN: 7
CO2: 29
Calcium: 8.3 — ABNORMAL LOW
Chloride: 102
Creatinine, Ser: 0.61
Creatinine, Ser: 0.76
GFR calc non Af Amer: >60
Glucose, Bld: 116 — ABNORMAL HIGH

## 2011-05-24 LAB — CBC
HCT: 27.6 — ABNORMAL LOW
HCT: 34.5 — ABNORMAL LOW
Hemoglobin: 9.9 — ABNORMAL LOW
MCHC: 34.5
MCHC: 35.2
MCV: 95.4
Platelets: 259
Platelets: 270
RDW: 12.3
RDW: 12.5
RDW: 13.1

## 2011-05-24 LAB — PROTIME-INR
INR: 1.1
Prothrombin Time: 14
Prothrombin Time: 14.2

## 2012-02-03 ENCOUNTER — Encounter: Payer: Self-pay | Admitting: Physical Medicine & Rehabilitation

## 2012-02-21 ENCOUNTER — Encounter: Payer: Medicare Other | Attending: Physical Medicine & Rehabilitation

## 2012-02-21 ENCOUNTER — Ambulatory Visit (HOSPITAL_BASED_OUTPATIENT_CLINIC_OR_DEPARTMENT_OTHER): Payer: Medicare Other | Admitting: Physical Medicine & Rehabilitation

## 2012-02-21 ENCOUNTER — Encounter: Payer: Self-pay | Admitting: Physical Medicine & Rehabilitation

## 2012-02-21 VITALS — BP 159/86 | HR 79 | Resp 14 | Ht 73.0 in | Wt 222.0 lb

## 2012-02-21 DIAGNOSIS — M961 Postlaminectomy syndrome, not elsewhere classified: Secondary | ICD-10-CM | POA: Insufficient documentation

## 2012-02-21 DIAGNOSIS — M545 Low back pain, unspecified: Secondary | ICD-10-CM | POA: Insufficient documentation

## 2012-02-21 DIAGNOSIS — G8929 Other chronic pain: Secondary | ICD-10-CM | POA: Insufficient documentation

## 2012-02-21 DIAGNOSIS — Z96659 Presence of unspecified artificial knee joint: Secondary | ICD-10-CM | POA: Insufficient documentation

## 2012-02-21 DIAGNOSIS — G894 Chronic pain syndrome: Secondary | ICD-10-CM | POA: Insufficient documentation

## 2012-02-21 DIAGNOSIS — M216X9 Other acquired deformities of unspecified foot: Secondary | ICD-10-CM | POA: Insufficient documentation

## 2012-02-21 DIAGNOSIS — G8928 Other chronic postprocedural pain: Secondary | ICD-10-CM | POA: Insufficient documentation

## 2012-02-21 DIAGNOSIS — M25569 Pain in unspecified knee: Secondary | ICD-10-CM | POA: Insufficient documentation

## 2012-02-21 NOTE — Patient Instructions (Signed)
May return to discuss alternative treatment approaches for back and knee pain

## 2012-02-21 NOTE — Progress Notes (Signed)
Subjective:    Patient ID: Charles Lara, male    DOB: Jan 14, 1961, 51 y.o.   MRN: [id number]  HPI The patient has a history of lumbar spinal stenosis at L4-L5 with chronic low back pain. He underwent an L4-L5 and L5-S1 fusion by neurosurgery. He has been discharged after good surgical outcome. He has been seeing a physical medicine and rehabilitation physician and an orthopedic practice. The patient also complains of chronic knee pain despite successful unicompartmental knee replacement on the left and total knee replacement on the right. He has been seen by orthopedic ankle and foot specialist who prescribed a left ankle brace for his chronic left foot drop which occurred prior to his lumbar fusion and was felt to be due to radiculopathy. This patient has been weaned down on his narcotic analgesics from 60 mg of morphine twice a day down to 15 mg 3 times per day. His pain level is 5/10. He indicates his pain is worse with walking bending and sitting and standing. He states that he has not received the left ankle brace prescribed because he could not afford it. His review of systems is positive for suicidal thoughts in the past. He does see a psychiatrist on a regular basis. He is independent with all his activities of daily living as well as ambulation. He does not use an assistive device Opioid risk total score is high at 10. Significant past history of illegal drug use as well as alcohol abuse. This is in combination with his psychiatric history and Pain Inventory Average Pain 7 Pain Right Now 5 My pain is constant, dull and aching  In the last 24 hours, has pain interfered with the following? General activity 6 Relation with others 2 Enjoyment of life 7 What TIME of day is your pain at its worst? morning Sleep (in general) Poor  Pain is worse with: walking, bending, sitting and standing Pain improves with: rest and medication Relief from Meds: 4  Mobility walk without assistance use  a cane how many minutes can you walk? 20 min ability to climb steps?  yes do you drive?  yes Do you have any goals in this area?  yes  Function disabled: date disabled 1995 Do you have any goals in this area?  no  Neuro/Psych bladder control problems weakness numbness trouble walking confusion depression anxiety suicidal thoughts-no plan or thoughts at this point, has seen psych in the past.  If his pain medication gets cut off he would not know what to do.  Prior Studies x-rays CT/MRI nerve study  Physicians involved in your care Any changes since last visit?  no   History reviewed. No pertinent family history. History   Social History  . Marital Status: Divorced    Spouse Name: N/A    Number of Children: N/A  . Years of Education: N/A   Social History Main Topics  . Smoking status: Current Everyday Smoker    Types: Cigarettes  . Smokeless tobacco: None  . Alcohol Use: No  . Drug Use: None  . Sexually Active: None   Other Topics Concern  . None   Social History Narrative  . None   Past Surgical History  Procedure Date  . Hernia repair   . Knee surgery   . Spine surgery   . Kidney stone surgery    Past Medical History  Diagnosis Date  . Attention deficit disorder   . Diaphoresis    BP 159/86  Pulse 79  Resp 14  Ht 6\' 1"  (1.854 m)  Wt 222 lb (100.699 kg)  BMI 29.29 kg/m2  SpO2 98%     Review of Systems  Constitutional: Positive for diaphoresis.  Gastrointestinal: Positive for constipation.  Musculoskeletal: Positive for back pain.  Neurological: Positive for weakness and numbness.  Psychiatric/Behavioral: Positive for suicidal ideas and dysphoric mood.  All other systems reviewed and are negative.       Objective:   Physical Exam  Constitutional: He is oriented to person, place, and time. He appears well-developed and well-nourished.  Musculoskeletal:       Right knee: He exhibits normal range of motion, no swelling, no  effusion, no erythema, normal alignment and no bony tenderness. tenderness found.       Left knee: He exhibits normal range of motion, no swelling, no effusion and no erythema. no tenderness found.       Healed bilateral anterior knee incisions. Healed lumbar incision Straight leg raising test is negative Left lower extremity foot evertor's are absent Left fifth metatarsal head large callus Ambulates on the lateral aspect of the foot on the left only.  Neurological: He is alert and oriented to person, place, and time. He displays atrophy. A sensory deficit is present. Gait abnormal.  Reflex Scores:      Patellar reflexes are 2+ on the right side and 2+ on the left side.      Achilles reflexes are 2+ on the right side and 0 on the left side.      L S1 dermatome decreased to pp L calf atrophy  Psychiatric: His affect is angry and inappropriate. He is agitated.       Obscenities when describing his referring MD          Assessment & Plan:  1. Chronic bilateral knee pain with physical exam findings consistent with successful knee replacement surgery. He has normal postoperative scarring, normal range of motion and no sign of infection or instability at this point. No pain with palpation. 2. Lumbar postlaminectomy syndrome his back is not painful and has nearly full range of motion.he does have the chronic foot drop but in no pain around the ankle area. No neurogenic pain. As I discussed with the patient, due to the elevated opioid risk to a score, I do not feel comfortable prescribing narcotic analgesics and his situation. Furthermore he seems quite comfortable even on minimal doses of narcotics and his physical exam shows no  sign of pain. The patient did not want to consider other alternative treatments such as tramadol, lumbar facet injections, or physical therapy. I offered to give him a list of anesthesiology pain medicine physicians which he declined.  On his way out of the office he  called his referring physician an obscene name.   The patient did not schedule a followup appointment

## 2012-04-21 ENCOUNTER — Other Ambulatory Visit: Payer: Self-pay | Admitting: Gastroenterology

## 2012-04-21 DIAGNOSIS — R748 Abnormal levels of other serum enzymes: Secondary | ICD-10-CM

## 2012-04-23 ENCOUNTER — Ambulatory Visit
Admission: RE | Admit: 2012-04-23 | Discharge: 2012-04-23 | Disposition: A | Discharge status: Home / Self Care | Payer: Medicare Other | Source: Ambulatory Visit | Attending: Gastroenterology | Admitting: Gastroenterology

## 2012-04-23 DIAGNOSIS — R748 Abnormal levels of other serum enzymes: Secondary | ICD-10-CM

## 2012-04-24 ENCOUNTER — Other Ambulatory Visit: Payer: Medicare Other

## 2013-10-07 ENCOUNTER — Other Ambulatory Visit: Payer: Self-pay | Admitting: Neurosurgery

## 2013-10-07 DIAGNOSIS — M48061 Spinal stenosis, lumbar region without neurogenic claudication: Secondary | ICD-10-CM

## 2013-10-11 ENCOUNTER — Other Ambulatory Visit: Payer: Medicare Other

## 2013-10-11 ENCOUNTER — Ambulatory Visit
Admission: RE | Admit: 2013-10-11 | Discharge: 2013-10-11 | Disposition: A | Discharge status: Home / Self Care | Payer: Medicare HMO | Source: Ambulatory Visit | Attending: Neurosurgery | Admitting: Neurosurgery

## 2013-10-11 DIAGNOSIS — M48061 Spinal stenosis, lumbar region without neurogenic claudication: Secondary | ICD-10-CM

## 2013-11-22 DIAGNOSIS — K219 Gastro-esophageal reflux disease without esophagitis: Secondary | ICD-10-CM | POA: Insufficient documentation

## 2013-11-22 DIAGNOSIS — E785 Hyperlipidemia, unspecified: Secondary | ICD-10-CM | POA: Insufficient documentation

## 2013-11-22 DIAGNOSIS — G8929 Other chronic pain: Secondary | ICD-10-CM | POA: Insufficient documentation

## 2016-06-19 ENCOUNTER — Other Ambulatory Visit (HOSPITAL_COMMUNITY): Payer: Self-pay | Admitting: Psychiatry

## 2016-06-26 ENCOUNTER — Other Ambulatory Visit (HOSPITAL_COMMUNITY): Payer: Self-pay | Admitting: Psychiatry

## 2016-07-10 ENCOUNTER — Other Ambulatory Visit (HOSPITAL_COMMUNITY): Payer: Self-pay | Admitting: Psychiatry

## 2016-07-29 ENCOUNTER — Other Ambulatory Visit (HOSPITAL_COMMUNITY): Payer: Self-pay | Admitting: Psychiatry

## 2016-10-05 ENCOUNTER — Other Ambulatory Visit (HOSPITAL_COMMUNITY): Payer: Self-pay | Admitting: Psychiatry

## 2016-10-08 ENCOUNTER — Other Ambulatory Visit (HOSPITAL_COMMUNITY): Payer: Self-pay | Admitting: Psychiatry

## 2016-10-09 ENCOUNTER — Other Ambulatory Visit (HOSPITAL_COMMUNITY): Payer: Self-pay | Admitting: Psychiatry

## 2016-11-01 LAB — BASIC METABOLIC PANEL
BUN: 21 (ref 4–21)
CREATININE: 1.1 (ref 0.6–1.3)

## 2016-11-01 LAB — CBC AND DIFFERENTIAL
HCT: 45 (ref 41–53)
HEMOGLOBIN: 14.5 (ref 13.5–17.5)
Neutrophils Absolute: 6
Platelets: 307 (ref 150–399)
WBC: 8.4

## 2016-11-29 ENCOUNTER — Encounter: Payer: Self-pay | Admitting: Family Medicine

## 2017-01-30 ENCOUNTER — Other Ambulatory Visit (HOSPITAL_COMMUNITY): Payer: Self-pay | Admitting: Psychiatry

## 2017-03-14 ENCOUNTER — Other Ambulatory Visit (HOSPITAL_COMMUNITY): Payer: Self-pay | Admitting: Psychiatry

## 2017-08-05 ENCOUNTER — Other Ambulatory Visit (HOSPITAL_COMMUNITY): Payer: Self-pay | Admitting: Psychiatry

## 2018-07-14 ENCOUNTER — Ambulatory Visit (INDEPENDENT_AMBULATORY_CARE_PROVIDER_SITE_OTHER): Payer: Medicare Other | Admitting: Family Medicine

## 2018-07-14 ENCOUNTER — Encounter: Payer: Self-pay | Admitting: Family Medicine

## 2018-07-14 VITALS — BP 134/86 | HR 69 | Temp 99.0°F | Ht 74.0 in | Wt 217.7 lb

## 2018-07-14 DIAGNOSIS — F329 Major depressive disorder, single episode, unspecified: Secondary | ICD-10-CM | POA: Insufficient documentation

## 2018-07-14 DIAGNOSIS — Z716 Tobacco abuse counseling: Secondary | ICD-10-CM | POA: Diagnosis not present

## 2018-07-14 DIAGNOSIS — N200 Calculus of kidney: Secondary | ICD-10-CM | POA: Insufficient documentation

## 2018-07-14 DIAGNOSIS — F32A Depression, unspecified: Secondary | ICD-10-CM | POA: Insufficient documentation

## 2018-07-14 DIAGNOSIS — I10 Essential (primary) hypertension: Secondary | ICD-10-CM | POA: Insufficient documentation

## 2018-07-14 DIAGNOSIS — Z7689 Persons encountering health services in other specified circumstances: Secondary | ICD-10-CM

## 2018-07-14 DIAGNOSIS — F17200 Nicotine dependence, unspecified, uncomplicated: Secondary | ICD-10-CM | POA: Diagnosis not present

## 2018-07-14 DIAGNOSIS — E785 Hyperlipidemia, unspecified: Secondary | ICD-10-CM

## 2018-07-14 DIAGNOSIS — E291 Testicular hypofunction: Secondary | ICD-10-CM | POA: Insufficient documentation

## 2018-07-14 DIAGNOSIS — R61 Generalized hyperhidrosis: Secondary | ICD-10-CM | POA: Diagnosis not present

## 2018-07-14 DIAGNOSIS — F988 Other specified behavioral and emotional disorders with onset usually occurring in childhood and adolescence: Secondary | ICD-10-CM | POA: Insufficient documentation

## 2018-07-14 DIAGNOSIS — N529 Male erectile dysfunction, unspecified: Secondary | ICD-10-CM | POA: Insufficient documentation

## 2018-07-14 NOTE — Progress Notes (Signed)
New patient office visit note:  Impression and Recommendations:    1. Encounter to establish care with new doctor   2. Tobacco use disorder-  proximally 45-pack-year history, current smoker   3. Tobacco abuse counseling   4. Generalized hyperhidrosis-seen by Endo in past   5. Hypertension, unspecified type   6. Kidney stones-has urologist   7. Hyperlipidemia, unspecified hyperlipidemia type     1. Specialty Follow Up - Emphasized the critical importance of patient continuing to follow up with specialists as established.  - Educated patient at length today about need for him to remain established with other specialists as recommended.  2. BMI Counseling Explained to patient what BMI refers to, and what it means medically.    Told patient to think about it as a "medical risk stratification measurement" and how increasing BMI is associated with increasing risk/ or worsening state of various diseases such as hypertension, hyperlipidemia, diabetes, premature OA, depression etc.  American Heart Association guidelines for healthy diet, basically Mediterranean diet, and exercise guidelines of 30 minutes 5 days per week or more discussed in detail.  Health counseling performed.  All questions answered.  3. Lifestyle & Preventative Health Maintenance - Advised patient to continue working toward exercising to improve overall mental, physical, and emotional health.    - Encouraged patient to engage in daily physical activity, especially a formal exercise routine.  Recommended that the patient eventually strive for at least 150 minutes of moderate cardiovascular activity per week according to guidelines established by the [facility].   - Healthy dietary habits encouraged, including low-carb, and high amounts of lean protein in diet.   - Patient should also consume adequate amounts of water.   Education and routine counseling performed. Handouts provided.  4. Follow Up - Prescriptions  refilled today. - Re-check fasting lab work in near future as recommended. - Otherwise, continue to return for CPE and chronic follow-up as scheduled.   - Patient knows to call in sooner if desired to address acute concerns.    Medications Discontinued During This Encounter  Medication Reason  . cyclobenzaprine (FLEXERIL) 10 MG tablet No longer needed (for PRN medications)  . morphine (MS CONTIN) 15 MG 12 hr tablet Completed Course  . sertraline (ZOLOFT) 100 MG tablet Change in therapy     Gross side effects, risk and benefits, and alternatives of medications discussed with patient.  Patient is aware that all medications have potential side effects and we are unable to predict every side effect or drug-drug interaction that may occur.  Expresses verbal understanding and consents to current therapy plan and treatment regimen.   Return for Next available OV  4 CPE/ yrly physical, come fasting for BW.   Please see AVS handed out to patient at the end of our visit for further patient instructions/ counseling done pertaining to today's office visit.    Note:  This document was prepared using Dragon voice recognition software and may include unintentional dictation errors.  This document serves as a record of services personally performed by Thomasene Loteborah Nini Cavan, DO. It was created on her behalf by Peggye FothergillKatherine Galloway, a trained medical scribe. The creation of this record is based on the scribe's personal observations and the provider's statements to them.   I have reviewed the above medical documentation for accuracy and completeness and I concur.  Thomasene Loteborah Rahsaan Weakland, DO, D.O. 07/14/2018 4:00 PM     -------------------------------------------------------------------------------------------------    Subjective:    Chief complaint:  Chief Complaint  Patient presents with  . Establish Care    HPI: Charles Lara is a pleasant 57 y.o. male who presents to [facility] today to review their medical history with me and establish care.   I asked the patient to review their chronic problem list with me to ensure everything was updated and accurate.    All recent office visits with other providers, any medical records that patient brought in etc  - I reviewed today.     We asked pt to get [place] their medical records from [facility] providers/ specialists that they had seen within the past 3-5 years- if they are in private practice and/or do not work for [employer], [facility], [facility], [facility] or [employer] owned practice.  Told them to call their specialists to clarify this if they are not sure.    Reason for establishing care: "I'm tired of driving to [facility] for one thing."  Was established there because of an old doctor & scheduling situation.  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  States he's had a couple of back surgeries, one at [facility], one at [facility], and notes that he had a "chin infection" that was treated at [facility].  Social History Patient states he has been disabled since 57.  Has a girlfriend that he lives with at home. Has two children, aged 91 and 64. Daughter was diagnosed with NF. No grandchildren.  Tobacco Use Current every day smoker, 1 ppd, 45 years on average. Used to smoke up to 2 packs per day, started in early teens. On average, feels he smokes a pack per day.  EtOH Use Drinks beer; likes Natural Light, 3-4 on a big day. Drinks 1-2 on weekdays.  Drug Use None  Lifestyle Habits Inactive due to his disability and back pain.  Family History Knows nothing about his father's side of history.  Surgical History Past Surgical History:  Procedure Laterality Date  . HERNIA REPAIR    . KIDNEY STONE SURGERY    . KNEE SURGERY    . LUMBAR FUSION    . MEDIAL PARTIAL KNEE REPLACEMENT    . REPLACEMENT TOTAL KNEE    . SPINE SURGERY     Past Medical History States "it hurts like hell to do anything" thanks to all of his back  surgeries.  Formerly followed up with Dr. Jillene Bucks with [facility]  Has not seen his former PCP in about a year.  Denies diabetes, heart attack, stroke, COPD, emphysema, thyroid problem.    - Orthopedics - Disabled since 1996 Notes his knees were swelling so bad he couldn't bend them to get into a truck to go to work.  Was told to get arthroscopic surgery.  Thought he could go back to work faster if he got both of them done at the same time.  Notes that he was 34 at the time and was told to put off knee replacement since he would have to have several done.  In 1998, medicine wasn't helping to control his pain anymore.  Notes he had unicompartmental surgery on his left knee.  Notes "they couldn't give me enough medicine because I'd gotten immune to it or something evidently."  A year later, he had his right knee done, "but it ended up not being as good of a knee."  He had this knee replaced "5 years later or something."  Dr. Eulah Pont did  the left knee.  Patient notes that he had his right knee total done through [facility] with Dr. Lequita Halt.  Dr. Newell Coral did his first back surgery; "it lasted about 3 years; I got to where I couldn't hardly walk."  Had trouble getting MRI approved; obtained an MRI through [facility] and was referred to Dr. Erma Heritage who did his second surgery.  He has had four back surgeries.  Dr. Jaynie Collins did his last two surgeries.  Notes "all of his lumbars are fused to the S."  Notes that he has several issues with his left leg, including concerns about his ankle, calf, knee, back, etc.  - Kidney Stones States that he has a "6 or 7 mm kidney stone in both kidneys."  He followed up with a specialist in the past and was told that his prostate looked good.  Hyperlipidemia "[Former PCP] said something about that one time; it was high, but he wasn't going to put me on medicine, he was just going to check it again and see how it was or  something."  - White Coat Syndrome Feels usually his blood pressure is high at the doctor's office.  Feels that his blood pressure lowers if he has time to relax.  Attention Deficit Disorder  - Excessive Sweating "For some reason, there's something wrong that makes me sweat."  Notes "I can just go to the grocery store and my shirt's wet."  States "in the summertime, I'm prisoner; if it's over 70 degrees, my shirt is drenched and it's all the way down my jeans."  Endocrinologist couldn't find anything.  States they tested thyroid etc.  Notes he's been trying to figure out what's causing it.  States the sweat "comes from everywhere," not any primary or isolated sites.  Notes it first started when he would walk to class at [employer] and start sweating.  "It's like a flip was switched" and states that now he sweats even when he isn't in pain.   Wt Readings from Last 3 Encounters:  07/14/18 217 lb 11.2 oz (98.7 kg)  02/21/12 222 lb (100.7 kg)   BP Readings from Last 3 Encounters:  07/14/18 134/86  02/21/12 (!) 159/86   Pulse Readings from Last 3 Encounters:  07/14/18 69  02/21/12 79   BMI Readings from Last 3 Encounters:  07/14/18 27.95 kg/m  02/21/12 29.29 kg/m    Patient Care Team    Relationship Specialty Notifications Start End  Thomasene Lot, DO PCP - General Family Medicine  07/14/18     Patient Active Problem List   Diagnosis Date Noted  . Chronic pain disorder 02/21/2012    Priority: High  . Hyperlipidemia 11/22/2013    Priority: Medium  . Generalized hyperhidrosis-seen by Endo in past 07/14/2018  . ED (erectile dysfunction) 07/14/2018  . Testosterone deficiency in male 07/14/2018  . Attention deficit disorder 07/14/2018  . h/o High blood pressure 07/14/2018  . Kidney stones-has urologist 07/14/2018  . Depression 07/14/2018  . Tobacco abuse counseling 07/14/2018  . Tobacco use disorder-  proximally 45-pack-year history, current smoker 07/14/2018  . Chronic  pain 11/22/2013  . GERD (gastroesophageal reflux disease) 11/22/2013  . Postlaminectomy syndrome, lumbar region 02/21/2012  . Other chronic postoperative pain 02/21/2012       As reported by pt:  Past Medical History:  Diagnosis Date  . Attention deficit disorder   . Diaphoresis   . High blood pressure   . High cholesterol   . Kidney stones  Past Surgical History:  Procedure Laterality Date  . HERNIA REPAIR    . KIDNEY STONE SURGERY    . KNEE SURGERY    . LUMBAR FUSION    . MEDIAL PARTIAL KNEE REPLACEMENT    . REPLACEMENT TOTAL KNEE    . SPINE SURGERY       History reviewed. No pertinent family history.   Social History   Substance and Sexual Activity  Drug Use Never     Social History   Substance and Sexual Activity  Alcohol Use Yes  . Alcohol/week: 12.0 standard drinks  . Types: 12 Standard drinks or equivalent per week     Social History   Tobacco Use  Smoking Status Current Every Day Smoker  . Packs/day: 1.00  . Years: 45.00  . Pack years: 45.00  . Types: Cigarettes  Smokeless Tobacco Never Used     Current Meds  Medication Sig  . amphetamine-dextroamphetamine (ADDERALL) 20 MG tablet Take 20 mg by mouth 3 (three) times daily.  . baclofen (LIORESAL) 10 MG tablet Take 1 tablet by mouth 2 (two) times daily.  Marland Kitchen buPROPion (WELLBUTRIN XL) 300 MG 24 hr tablet Take 300 mg by mouth daily.  . diclofenac (VOLTAREN) 75 MG EC tablet Take 75 mg by mouth 2 (two) times daily.  . DULoxetine (CYMBALTA) 60 MG capsule Take 2 capsules by mouth daily.  . meloxicam (MOBIC) 15 MG tablet Take 1 tablet by mouth daily.  . pramipexole (MIRAPEX) 0.5 MG tablet Take 1 tablet by mouth daily.  . pregabalin (LYRICA) 100 MG capsule Take 1 capsule by mouth 2 (two) times daily. 1 cap at supper and 1 at bedtime  . Testosterone Cypionate 200 MG/ML SOLN Inject into the muscle.    Allergies: Patient has no known allergies.   Review of Systems  Constitutional: Negative  for chills, diaphoresis, fever (chronic), malaise/fatigue and weight loss.       Chronic excessive sweating.  HENT: Negative for congestion, sore throat and tinnitus.   Eyes: Negative for blurred vision, double vision and photophobia.  Respiratory: Negative for cough and wheezing.   Cardiovascular: Negative for chest pain and palpitations.  Gastrointestinal: Negative for blood in stool, diarrhea, nausea and vomiting.  Genitourinary: Negative for dysuria, frequency and urgency.       Chronic nighttime urination, chronic erectile dysfunction.  Musculoskeletal: Positive for joint pain (chronic) and myalgias (chronic).  Skin: Negative for itching and rash.  Neurological: Negative for dizziness, focal weakness, weakness and headaches.  Endo/Heme/Allergies: Negative for environmental allergies and polydipsia. Does not bruise/bleed easily.  Psychiatric/Behavioral: Positive for depression (chronic). Negative for memory loss. The patient is not nervous/anxious and does not have insomnia.      Objective:   Blood pressure 134/86, pulse 69, temperature 99 F (37.2 C), height 6\' 2"  (1.88 m), weight 217 lb 11.2 oz (98.7 kg), SpO2 98 %. Body mass index is 27.95 kg/m. General: Well Developed, well nourished, and in no acute distress.  Neuro: Alert and oriented x3, extra-ocular muscles intact, sensation grossly intact.  HEENT:[place]/AT, PERRLA, neck supple, No carotid bruits Skin: no gross rashes  Cardiac: Regular rate and rhythm Respiratory: Essentially clear to auscultation bilaterally. Not using accessory muscles, speaking in full sentences.  Abdominal: not grossly distended Musculoskeletal: Ambulates w/o diff, FROM * 4 ext.  Vasc: less 2 sec cap RF, warm and pink  Psych:  No HI/SI, judgement and insight good, Euthymic mood. Full Affect.    No results found for this or any previous visit (from  the past 2160 hour(s)).

## 2018-07-14 NOTE — Patient Instructions (Addendum)
   Please realize, EXERCISE IS MEDICINE!  -  American Heart Association ( AHA) guidelines for exercise : If you are in good health, without any medical conditions, you should engage in 150-300 minutes of moderate intensity aerobic activity per week.  This means you should be huffing and puffing throughout your workout.   Engaging in regular exercise will improve brain function and memory, as well as improve mood, boost immune system and help with weight management.  As well as the other, more well-known effects of exercise such as decreasing blood sugar levels, decreasing blood pressure,  and decreasing bad cholesterol levels/ increasing good cholesterol levels.     -  The AHA strongly endorses consumption of a diet that contains a variety of foods from all the food categories with an emphasis on fruits and vegetables; fat-free and low-fat dairy products; cereal and grain products; legumes and nuts; and fish, poultry, and/or extra lean meats.    Excessive food intake, especially of foods high in saturated and trans fats, sugar, and salt, should be avoided.    Adequate water intake of roughly 1/2 of your weight in pounds, should equal the ounces of water per day you should drink.  So for instance, if you're 200 pounds, that would be 100 ounces of water per day.         Mediterranean Diet  Why follow it? Research shows. . Those who follow the Mediterranean diet have a reduced risk of heart disease  . The diet is associated with a reduced incidence of Parkinson's and Alzheimer's diseases . People following the diet may have longer life expectancies and lower rates of chronic diseases  . The Dietary Guidelines for Americans recommends the Mediterranean diet as an eating plan to promote health and prevent disease  What Is the Mediterranean Diet?  . Healthy eating plan based on typical foods and recipes of Mediterranean-style cooking . The diet is primarily a plant based diet; these foods should  make up a majority of meals   Starches - Plant based foods should make up a majority of meals - They are an important sources of vitamins, minerals, energy, antioxidants, and fiber - Choose whole grains, foods high in fiber and minimally processed items  - Typical grain sources include wheat, oats, barley, corn, brown rice, bulgar, farro, millet, polenta, couscous  - Various types of beans include chickpeas, lentils, fava beans, black beans, white beans   Fruits  Veggies - Large quantities of antioxidant rich fruits & veggies; 6 or more servings  - Vegetables can be eaten raw or lightly drizzled with oil and cooked  - Vegetables common to the traditional Mediterranean Diet include: artichokes, arugula, beets, broccoli, brussel sprouts, cabbage, carrots, celery, collard greens, cucumbers, eggplant, kale, leeks, lemons, lettuce, mushrooms, okra, onions, peas, peppers, potatoes, pumpkin, radishes, rutabaga, shallots, spinach, sweet potatoes, turnips, zucchini - Fruits common to the Mediterranean Diet include: apples, apricots, avocados, cherries, clementines, dates, figs, grapefruits, grapes, melons, nectarines, oranges, peaches, pears, pomegranates, strawberries, tangerines  Fats - Replace butter and margarine with healthy oils, such as olive oil, canola oil, and tahini  - Limit nuts to no more than a handful a day  - Nuts include walnuts, almonds, pecans, pistachios, pine nuts  - Limit or avoid candied, honey roasted or heavily salted nuts - Olives are central to the Mediterranean diet - can be eaten whole or used in a variety of dishes   Meats Protein - Limiting red meat: no more than a few   times a month - When eating red meat: choose lean cuts and keep the portion to the size of deck of cards - Eggs: approx. 0 to 4 times a week  - Fish and lean poultry: at least 2 a week  - Healthy protein sources include, chicken, turkey, lean beef, lamb - Increase intake of seafood such as tuna, salmon,  trout, mackerel, shrimp, scallops - Avoid or limit high fat processed meats such as sausage and bacon  Dairy - Include moderate amounts of low fat dairy products  - Focus on healthy dairy such as fat free yogurt, skim milk, low or reduced fat cheese - Limit dairy products higher in fat such as whole or 2% milk, cheese, ice cream  Alcohol - Moderate amounts of red wine is ok  - No more than 5 oz daily for women (all ages) and men older than age 65  - No more than 10 oz of wine daily for men younger than 65  Other - Limit sweets and other desserts  - Use herbs and spices instead of salt to flavor foods  - Herbs and spices common to the traditional Mediterranean Diet include: basil, bay leaves, chives, cloves, cumin, fennel, garlic, lavender, marjoram, mint, oregano, parsley, pepper, rosemary, sage, savory, sumac, tarragon, thyme   It's not just a diet, it's a lifestyle:  . The Mediterranean diet includes lifestyle factors typical of those in the region  . Foods, drinks and meals are best eaten with others and savored . Daily physical activity is important for overall good health . This could be strenuous exercise like running and aerobics . This could also be more leisurely activities such as walking, housework, yard-work, or taking the stairs . Moderation is the key; a balanced and healthy diet accommodates most foods and drinks . Consider portion sizes and frequency of consumption of certain foods   Meal Ideas & Options:  . Breakfast:  o Whole wheat toast or whole wheat English muffins with peanut butter & hard boiled egg o Steel cut oats topped with apples & cinnamon and skim milk  o Fresh fruit: banana, strawberries, melon, berries, peaches  o Smoothies: strawberries, bananas, greek yogurt, peanut butter o Low fat greek yogurt with blueberries and granola  o Egg white omelet with spinach and mushrooms o Breakfast couscous: whole wheat couscous, apricots, skim milk, cranberries   . Sandwiches:  o Hummus and grilled vegetables (peppers, zucchini, squash) on whole wheat bread   o Grilled chicken on whole wheat pita with lettuce, tomatoes, cucumbers or tzatziki  o Tuna salad on whole wheat bread: tuna salad made with greek yogurt, olives, red peppers, capers, green onions o Garlic rosemary lamb pita: lamb sauted with garlic, rosemary, salt & pepper; add lettuce, cucumber, greek yogurt to pita - flavor with lemon juice and black pepper  . Seafood:  o Mediterranean grilled salmon, seasoned with garlic, basil, parsley, lemon juice and black pepper o Shrimp, lemon, and spinach whole-grain pasta salad made with low fat greek yogurt  o Seared scallops with lemon orzo  o Seared tuna steaks seasoned salt, pepper, coriander topped with tomato mixture of olives, tomatoes, olive oil, minced garlic, parsley, green onions and cappers  . Meats:  o Herbed greek chicken salad with kalamata olives, cucumber, feta  o Red bell peppers stuffed with spinach, bulgur, lean ground beef (or lentils) & topped with feta   o Kebabs: skewers of chicken, tomatoes, onions, zucchini, squash  o Turkey burgers: made with red onions, mint,   dill, lemon juice, feta cheese topped with roasted red peppers . Vegetarian o Cucumber salad: cucumbers, artichoke hearts, celery, red onion, feta cheese, tossed in olive oil & lemon juice  o Hummus and whole grain pita points with a greek salad (lettuce, tomato, feta, olives, cucumbers, red onion) o Lentil soup with celery, carrots made with vegetable broth, garlic, salt and pepper  o Tabouli salad: parsley, bulgur, mint, scallions, cucumbers, tomato, radishes, lemon juice, olive oil, salt and pepper.  Please think seriously about quitting smoking!  This is very important for your health and well being.   Smoking cessation instruction/counseling given:  counseled patient on the dangers of tobacco use, advised patient to stop smoking, and reviewed strategies to  maximize success  Discussed with patient that there are multiple treatments to aid in quitting smoking, however I explained none will work unless pt really wants to quit  Please let us know in the future if you are interested and ready to quit.  You can also call 1-800-QUIT-NOW 410-178-0913) for free smoking cessation counseling and support.     Also, please go online to www.heart.org (the American Heart Association website) and search "quit smoking ".     Or try the centers for disease control website at: http://bell-fernandez.com/  Or, go to the "national cancer institute" web site of [employer]:  TodayAlert.de  There is a lot of great information on these websites for you to look over.      Want to Quit Smoking? FDA-Approved Products Can Help  Quitting smoking can be hard, but it is possible. In fact, every time you put out a cigarette is a new chance to try quitting again, according to the [place] Food and Drug Administration's newest tobacco education campaign, "Every Try Counts."   If you want to quit-almost 70 percent of adult smokers say they do-you may want to use a "smoking cessation" product proven to help. Data has shown that using FDA-approved cessation medicine can double your chance of quitting successfully.  Some products contain nicotine as an active ingredient and others do not. These products include over-the-counter (OTC) options like skin patches, lozenges, and gum, as well as prescription medicines.  Smoking cessation products are intended to help you quit smoking. They are regulated through the [facility], which ensures that the products are safe and effective and that their benefits outweigh any known associated risks.  The Benefits of Quitting Smoking No matter how much you smoke-or for how long-quitting will benefit you.  Not only will you  lower your risk of getting various cancers, including lung cancer, you'll also reduce your chances of having heart disease, a stroke, emphysema, and other serious diseases. Quitting also will lower the risk of heart disease and lung cancer in nonsmokers who otherwise would be exposed to your secondhand smoke.  Although there are benefits to quitting at any age, it is important to quit as soon as possible so your body can begin to heal from the damage caused by smoking. For instance, 12 hours after you quit smoking the carbon monoxide level in your blood drops to normal. Carbon monoxide is harmful because it displaces oxygen in the blood and deprives your heart, brain, and other vital organs of oxygen.  What To Know About Smoking Cessation Products Understanding how smoking cessation products work-and what side effects they may cause-can help you determine which product may be best for you.  If you're considering one of these products, reading labels and talking  to your pharmacist and other health care providers are good first steps to take.  You also can check the FDA's website for more information on each product at Drugs@FDA , where you can search for each product by name.  And remember to weigh each product's benefits and risks, among other considerations.  About Nicotine Replacement Therapy (NRT) Nicotine is the substance primarily responsible for causing addiction to tobacco products. Tobacco users who are addicted to nicotine are used to having nicotine in their bodies.  As you try to quit smoking, you may have symptoms of nicotine withdrawal. When you quit, this withdrawal may cause symptoms like cravings, or urges, to smoke; depression; trouble sleeping; irritability; anxiety; and increased appetite.  Nicotine withdrawal can discourage some smokers from continuing with a quit attempt. But the FDA has approved several smoking cessation products designed to help users gradually withdraw from  smoking (that is, "wean" themselves from smoking) by using specific amounts of nicotine that decrease over time. This type of product is called a "nicotine replacement therapy" or NRT. It supplies nicotine in controlled amounts while sparing you from other chemicals found in tobacco products.  NRTs are available over the counter and by prescription. You should generally use them only for a short time to help you manage nicotine cravings and withdrawal. However, the FDA recognizes that some people may need to use these products longer to stay smoke-free. Talk to your health care provider to determine the best course of treatment for you.  Over-the-counter NRTs are approved for sale to people age 58 and older. They are available under various brand names and sometimes as generic products. They include:  - Skin patches (also called "transdermal nicotine patches"). These patches are placed on the skin, similar to how you would apply an adhesive bandage. - Chewing gum (also called "nicotine gum"). This gum must be chewed according to the labeled instructions to be effective. - Lozenges (also called "nicotine lozenges"). You use these products by dissolving them in your mouth. For over-the-counter products, it's important to follow the instructions on the Drug Facts Label (DFL) and to read the enclosed User's Guide for complete directions and other important information. Ask your health care provider if you have questions.  Currently, prescription nicotine replacement therapy is available only under the brand name Nicotrol, and is available both as a nasal spray and an oral inhaler. The products are FDA-approved only for use by adults.  If you are under age 58 and want to quit smoking, talk to a health care professional about whether you should use nicotine replacement therapies.  Important Advice for People Considering Nicotine Replacement Therapy Women who are pregnant or breastfeeding should talk to  their health care providers and use nicotine replacement products only if the health care providers approve.  Also talk to your health care provider before using these products if you have:  diabetes, heart disease, asthma, or stomach ulcers; had a recent heart attack; high blood pressure that is not controlled with medicine; a history of irregular heartbeat; or been prescribed medication to help you quit smoking. If you take prescription medication for depression or asthma, tell your health care provider if you are quitting smoking because he or she may need to change your prescription dose.  Stop using a nicotine replacement product and call your health care professional if you have any of the following symptoms: nausea; dizziness; weakness; vomiting; fast or irregular heartbeat; mouth problems with the lozenge or gum; or redness or swelling of  the skin around the patch that does not go away.  About Prescription Cessation Medicines Without Nicotine  The FDA has approved two smoking cessation products that do not contain nicotine. They are Chantix (varenicline tartrate) and Zyban (buproprion hydrochloride). Both are available in tablet form and by prescription only.  Chantix acts at sites in the brain affected by nicotine by reducing the rewarding effects of nicotine. The precise way that Zyban helps with smoking cessation is unknown.  As with other prescription products, the FDA has evaluated these medicines and found that the benefits outweigh the risks. For users taking these products, risks include changes in behavior, depressed mood, hostility, aggression, and suicidal thoughts or actions.  The most common side effects of Chantix include nausea; constipation; gas; vomiting; and trouble sleeping or vivid, unusual, or strange dreams. Chantix also may change how you react to alcohol, so talk to your health care provider about your drinking habits (if you drink alcohol) and whether  these habits need to change. Chantix is not recommended for people under the age of 43.  The most commonly observed side effects consistently associated with the use of Zyban are dry mouth and insomnia.  Because Zyban contains the same active ingredient as the antidepressant Wellbutrin (bupropion), the FDA encourages people who use Zyban-and those who are considering it-to talk to their health care providers about the risks of treatment with antidepressant medicines. Zyban has not been studied in children under the age of 30 and is not approved for use in children and teenagers.  Note: If your health care provider prescribes Chantix or Zyban, please read the product's patient medication guide in its entirety. These guides offer important information on side effects, risks, warnings, product ingredients, and what you should talk about with your health care provider before taking the products.  Finally, if you ever have any side effects related to any smoking cessation products, or have any other problems related to your treatment, the FDA would like to hear from you. Please consider making a voluntary and confidential report to the FDA's MedWatch program.  Updated: August 12, 2016

## 2018-09-09 ENCOUNTER — Other Ambulatory Visit (HOSPITAL_COMMUNITY): Payer: Self-pay | Admitting: Psychiatry

## 2018-09-10 ENCOUNTER — Encounter: Payer: Medicare Other | Admitting: Family Medicine

## 2018-09-11 ENCOUNTER — Other Ambulatory Visit (HOSPITAL_COMMUNITY): Payer: Self-pay | Admitting: Psychiatry

## 2018-09-17 ENCOUNTER — Other Ambulatory Visit (HOSPITAL_COMMUNITY): Payer: Self-pay | Admitting: Psychiatry

## 2018-10-29 ENCOUNTER — Encounter: Payer: Self-pay | Admitting: Emergency Medicine

## 2018-10-29 ENCOUNTER — Ambulatory Visit
Admission: EM | Admit: 2018-10-29 | Discharge: 2018-10-29 | Disposition: A | Discharge status: Home / Self Care | Payer: Medicare Other | Attending: Nurse Practitioner | Admitting: Nurse Practitioner

## 2018-10-29 ENCOUNTER — Ambulatory Visit (INDEPENDENT_AMBULATORY_CARE_PROVIDER_SITE_OTHER): Payer: Medicare Other

## 2018-10-29 DIAGNOSIS — J206 Acute bronchitis due to rhinovirus: Secondary | ICD-10-CM

## 2018-10-29 DIAGNOSIS — R062 Wheezing: Secondary | ICD-10-CM

## 2018-10-29 MED ORDER — PROMETHAZINE-DM 6.25-15 MG/5ML PO SYRP: 5.0000 mL | ORAL_SOLUTION | Freq: Four times a day (QID) | ORAL | 0 refills | Status: DC | PRN

## 2018-10-29 MED ORDER — PREDNISONE 20 MG PO TABS: 40.0000 mg | ORAL_TABLET | Freq: Every day | ORAL | 0 refills | Status: AC

## 2018-10-29 MED ORDER — AMOXICILLIN-POT CLAVULANATE 875-125 MG PO TABS: 1.0000 | ORAL_TABLET | Freq: Two times a day (BID) | ORAL | 0 refills | Status: DC

## 2018-10-29 MED ORDER — IPRATROPIUM-ALBUTEROL 0.5-2.5 (3) MG/3ML IN SOLN
3.0000 mL | Freq: Once | RESPIRATORY_TRACT | Status: AC
  Administered 2018-10-29: 3 mL via RESPIRATORY_TRACT

## 2018-10-29 MED ORDER — MONTELUKAST SODIUM 10 MG PO TABS: 10.0000 mg | ORAL_TABLET | Freq: Every day | ORAL | 0 refills | Status: DC

## 2018-10-29 MED ORDER — DM-GUAIFENESIN ER 30-600 MG PO TB12: 1.0000 | ORAL_TABLET | Freq: Two times a day (BID) | ORAL | 0 refills | Status: AC

## 2018-10-29 NOTE — ED Triage Notes (Signed)
Pt here for cough and congestion x 2 days 

## 2018-10-29 NOTE — ED Provider Notes (Signed)
[facility]    CSN: [id number] Arrival date & time: 10/29/18  1813     History   Chief Complaint Chief Complaint  Patient presents with  . Cough    HPI Charles Lara is a 58 y.o. male.   Subjective:   Charles Lara is a 58 y.o. male here for evaluation of a cough.  The cough is productive of green/yellow sputum, with wheezing, with shortness of breath and is aggravated by nothing. Onset of symptoms was 5 days ago, gradually worsening since that time.  Associated symptoms include subjective fever, sweats and runny nose.  Symptoms are worse at night.  Patient does not have a history of asthma. Patient has not had recent travel. Patient does have a history of smoking.  Patient smokes about 1/2 pack of cigarettes daily.  Patient has not had a previous chest x-ray.   The following portions of the patient's history were reviewed and updated as appropriate: allergies, current medications, past family history, past medical history, past social history, past surgical history and problem list.       Past Medical History:  Diagnosis Date  . Attention deficit disorder   . Diaphoresis   . High blood pressure   . High cholesterol   . Kidney stones     Patient Active Problem List   Diagnosis Date Noted  . Generalized hyperhidrosis-seen by Endo in past 07/14/2018  . ED (erectile dysfunction) 07/14/2018  . Testosterone deficiency in male 07/14/2018  . Attention deficit disorder 07/14/2018  . h/o High blood pressure 07/14/2018  . Kidney stones-has urologist 07/14/2018  . Depression 07/14/2018  . Tobacco abuse counseling 07/14/2018  . Tobacco use disorder-  proximally 45-pack-year history, current smoker 07/14/2018  . Chronic pain 11/22/2013  . GERD (gastroesophageal reflux disease) 11/22/2013  . Hyperlipidemia 11/22/2013  . Chronic pain disorder 02/21/2012  . Postlaminectomy syndrome, lumbar region 02/21/2012  . Other chronic postoperative pain 02/21/2012    Past  Surgical History:  Procedure Laterality Date  . HERNIA REPAIR    . KIDNEY STONE SURGERY    . KNEE SURGERY    . LUMBAR FUSION    . MEDIAL PARTIAL KNEE REPLACEMENT    . REPLACEMENT TOTAL KNEE    . SPINE SURGERY         Home Medications    Prior to Admission medications   Medication Sig Start Date End Date Taking? Authorizing Provider  amoxicillin-clavulanate (AUGMENTIN) 875-125 MG tablet Take 1 tablet by mouth every 12 (twelve) hours. 10/29/18   Lurline Idol, FNP  amphetamine-dextroamphetamine (ADDERALL) 20 MG tablet Take 20 mg by mouth 3 (three) times daily.    [provider]  baclofen (LIORESAL) 10 MG tablet Take 1 tablet by mouth 2 (two) times daily. 07/13/18   [provider]  buPROPion (WELLBUTRIN XL) 300 MG 24 hr tablet Take 300 mg by mouth daily.    [provider]  dextromethorphan-guaiFENesin (MUCINEX DM) 30-600 MG 12hr tablet Take 1 tablet by mouth 2 (two) times daily for 7 days. 10/29/18 11/05/18  Lurline Idol, FNP  diclofenac (VOLTAREN) 75 MG EC tablet Take 75 mg by mouth 2 (two) times daily.    [provider]  DULoxetine (CYMBALTA) 60 MG capsule Take 2 capsules by mouth daily. 07/13/18   [provider]  meloxicam (MOBIC) 15 MG tablet Take 1 tablet by mouth daily. 07/13/18   [provider]  montelukast (SINGULAIR) 10 MG tablet Take 1 tablet (10 mg total) by mouth at bedtime. 10/29/18  Lurline Idol, FNP  pramipexole (MIRAPEX) 0.5 MG tablet Take 1 tablet by mouth daily. 07/13/18   [provider]  predniSONE (DELTASONE) 20 MG tablet Take 2 tablets (40 mg total) by mouth daily for 5 days. 10/29/18 11/03/18  Lurline Idol, FNP  pregabalin (LYRICA) 100 MG capsule Take 1 capsule by mouth 2 (two) times daily. 1 cap at supper and 1 at bedtime    [provider]  promethazine-dextromethorphan (PROMETHAZINE-DM) 6.25-15 MG/5ML syrup Take 5 mLs by mouth 4 (four) times daily as needed for cough. 10/29/18    Lurline Idol, FNP  Testosterone Cypionate 200 MG/ML SOLN Inject into the muscle.    [provider]    Family History History reviewed. No pertinent family history.  Social History Social History   Tobacco Use  . Smoking status: Current Every Day Smoker    Packs/day: 1.00    Years: 45.00    Pack years: 45.00    Types: Cigarettes  . Smokeless tobacco: Never Used  Substance Use Topics  . Alcohol use: Yes    Alcohol/week: 12.0 standard drinks    Types: 12 Standard drinks or equivalent per week  . Drug use: Never     Allergies   Patient has no known allergies.   Review of Systems Review of Systems  Constitutional: Positive for diaphoresis and fever.  HENT: Positive for congestion.   Respiratory: Positive for cough, shortness of breath and wheezing.   All other systems reviewed and are negative.    Physical Exam Triage Vital Signs ED Triage Vitals  Enc Vitals Group     BP 10/29/18 1824 126/77     Pulse Rate 10/29/18 1824 84     Resp 10/29/18 1824 18     Temp 10/29/18 1824 99.1 F (37.3 C)     Temp Source 10/29/18 1824 Oral     SpO2 10/29/18 1824 95 %     Weight --      Height --      Head Circumference --      Peak Flow --      Pain Score 10/29/18 1825 7     Pain Loc --      Pain Edu? --      Excl. in GC? --    No data found.  Updated Vital Signs BP 126/77 (BP Location: Right Arm)   Pulse 84   Temp 99.1 F (37.3 C) (Oral)   Resp 18   SpO2 95%   Visual Acuity Right Eye Distance:   Left Eye Distance:   Bilateral Distance:    Right Eye Near:   Left Eye Near:    Bilateral Near:     Physical Exam Vitals signs reviewed.  Constitutional:      General: He is not in acute distress.    Appearance: Normal appearance. He is not ill-appearing, toxic-appearing or diaphoretic.  HENT:     Head: Normocephalic.  Neck:     Musculoskeletal: Normal range of motion.  Cardiovascular:     Rate and Rhythm: Normal rate and regular rhythm.    Pulmonary:     Effort: Pulmonary effort is normal. No respiratory distress.     Breath sounds: No stridor. Wheezing present. No rhonchi or rales.  Musculoskeletal: Normal range of motion.  Lymphadenopathy:     Cervical: No cervical adenopathy.  Skin:    General: Skin is warm and dry.  Neurological:     General: No focal deficit present.     Mental Status: He is  alert and oriented to person, place, and time.  Psychiatric:        Mood and Affect: Mood normal.        Behavior: Behavior normal.        Thought Content: Thought content normal.        Judgment: Judgment normal.      UC Treatments / Results  Labs (all labs ordered are listed, but only abnormal results are displayed) Labs Reviewed - No data to display  EKG None  Radiology Dg Chest 2 View  Result Date: 10/29/2018 CLINICAL DATA:  Cough, congestion, shortness of breath EXAM: CHEST - 2 VIEW COMPARISON:  None. FINDINGS: Moderate-sized hiatal hernia. Mild peribronchial thickening and interstitial prominence. Heart is normal size. No confluent opacities or effusions. No acute bony abnormality. IMPRESSION: Moderate-sized hiatal hernia. Mild bronchitic changes. Electronically Signed   By: Charlett Nose M.D.   On: 10/29/2018 18:46    Procedures Procedures (including critical care time)  Medications Ordered in UC Medications  ipratropium-albuterol (DUONEB) 0.5-2.5 (3) MG/3ML nebulizer solution 3 mL (3 mLs Nebulization Given 10/29/18 1854)    Initial Impression / Assessment and Plan / UC Course  I have reviewed the triage vital signs and the nursing notes.  Pertinent labs & imaging results that were available during my care of the patient were reviewed by me and considered in my medical decision making (see chart for details).     58 year old male presenting with a 5-day history of worsening productive cough with wheezing and shortness of breath.  Patient is afebrile.  Vital signs stable.  Wheezing noted. Remainder of  exam unremarkable. CXR shows mild bronchitic changes.  DuoNeb given in clinic.  Plan:  Antibiotics per medication orders. Antitussives per medication orders. Avoid exposure to tobacco smoke and fumes. B-agonist inhaler. Smoking cessation highly encouraged  Call PCP or go to ED if shortness of breath worsens, blood in sputum, change in character of cough, development of fever or chills, inability to maintain nutrition and hydration.   Today's evaluation has revealed no signs of a dangerous process. Discussed diagnosis with patient. Patient aware of their diagnosis, possible red flag symptoms to watch out for and need for close follow up. Patient understands verbal and written discharge instructions. Patient comfortable with plan and disposition.  Patient has a clear mental status at this time, good insight into illness (after discussion and teaching) and has clear judgment to make decisions regarding their care.  Documentation was completed with the aid of voice recognition software. Transcription may contain typographical errors.  Final Clinical Impressions(s) / UC Diagnoses   Final diagnoses:  Acute bronchitis due to Rhinovirus     Discharge Instructions     Take medications as directed.  You may also take Tylenol or ibuprofen as needed for fevers/pain.  Drink lots of fluids.  Stop smoking.  Follow-up with your primary care doctor in 1 week if no improvement in your symptoms.     ED Prescriptions    Medication Sig Dispense Auth. Provider   amoxicillin-clavulanate (AUGMENTIN) 875-125 MG tablet Take 1 tablet by mouth every 12 (twelve) hours. 14 tablet Jes Costales, [place], FNP   predniSONE (DELTASONE) 20 MG tablet Take 2 tablets (40 mg total) by mouth daily for 5 days. 10 tablet Lurline Idol, FNP   dextromethorphan-guaiFENesin [facility] DM) 30-600 MG 12hr tablet Take 1 tablet by mouth 2 (two) times daily for 7 days. 14 tablet Lurline Idol, FNP   promethazine-dextromethorphan  (PROMETHAZINE-DM) 6.25-15 MG/5ML syrup Take 5 mLs by mouth  4 (four) times daily as needed for cough. 120 mL Zilla Shartzer, FNP   montelukLelon Mastast (SINGULAIR) 10 MG tablet Take 1 tablet (10 mg total) by mouth at bedtime. 14 tablet Lurline IdolMurrill, Abigale Dorow, FNP     Controlled Substance Prescriptions [place] Controlled Substance Registry consulted? Not Applicable   Lurline IdolMurrill, Adarius Tigges, FNP 10/29/18 1859

## 2018-10-29 NOTE — Discharge Instructions (Addendum)
Take medications as directed.  You may also take Tylenol or ibuprofen as needed for fevers/pain.  Drink lots of fluids.  Stop smoking.  Follow-up with your primary care doctor in 1 week if no improvement in your symptoms.

## 2019-07-07 DIAGNOSIS — Z1211 Encounter for screening for malignant neoplasm of colon: Secondary | ICD-10-CM | POA: Diagnosis not present

## 2019-07-07 DIAGNOSIS — M4316 Spondylolisthesis, lumbar region: Secondary | ICD-10-CM | POA: Diagnosis not present

## 2019-07-07 DIAGNOSIS — E7800 Pure hypercholesterolemia, unspecified: Secondary | ICD-10-CM | POA: Diagnosis not present

## 2019-07-07 DIAGNOSIS — R350 Frequency of micturition: Secondary | ICD-10-CM | POA: Diagnosis not present

## 2019-07-07 DIAGNOSIS — R7989 Other specified abnormal findings of blood chemistry: Secondary | ICD-10-CM | POA: Diagnosis not present

## 2019-10-06 DIAGNOSIS — R935 Abnormal findings on diagnostic imaging of other abdominal regions, including retroperitoneum: Secondary | ICD-10-CM | POA: Diagnosis not present

## 2019-10-06 DIAGNOSIS — E785 Hyperlipidemia, unspecified: Secondary | ICD-10-CM | POA: Diagnosis not present

## 2019-10-06 DIAGNOSIS — I10 Essential (primary) hypertension: Secondary | ICD-10-CM | POA: Diagnosis not present

## 2019-10-06 DIAGNOSIS — K449 Diaphragmatic hernia without obstruction or gangrene: Secondary | ICD-10-CM | POA: Diagnosis not present

## 2019-10-06 DIAGNOSIS — G5603 Carpal tunnel syndrome, bilateral upper limbs: Secondary | ICD-10-CM | POA: Diagnosis not present

## 2019-10-06 DIAGNOSIS — R7989 Other specified abnormal findings of blood chemistry: Secondary | ICD-10-CM | POA: Diagnosis not present

## 2019-10-06 DIAGNOSIS — K629 Disease of anus and rectum, unspecified: Secondary | ICD-10-CM | POA: Diagnosis not present

## 2019-10-06 DIAGNOSIS — N200 Calculus of kidney: Secondary | ICD-10-CM | POA: Diagnosis not present

## 2019-10-11 DIAGNOSIS — M180 Bilateral primary osteoarthritis of first carpometacarpal joints: Secondary | ICD-10-CM | POA: Diagnosis not present

## 2019-10-12 DIAGNOSIS — M19032 Primary osteoarthritis, left wrist: Secondary | ICD-10-CM | POA: Diagnosis not present

## 2019-10-12 DIAGNOSIS — M180 Bilateral primary osteoarthritis of first carpometacarpal joints: Secondary | ICD-10-CM | POA: Diagnosis not present

## 2019-10-12 DIAGNOSIS — M19031 Primary osteoarthritis, right wrist: Secondary | ICD-10-CM | POA: Diagnosis not present

## 2019-10-12 DIAGNOSIS — M1812 Unilateral primary osteoarthritis of first carpometacarpal joint, left hand: Secondary | ICD-10-CM | POA: Diagnosis not present

## 2019-10-12 DIAGNOSIS — M25531 Pain in right wrist: Secondary | ICD-10-CM | POA: Diagnosis not present

## 2019-10-12 DIAGNOSIS — M1811 Unilateral primary osteoarthritis of first carpometacarpal joint, right hand: Secondary | ICD-10-CM | POA: Diagnosis not present

## 2019-10-12 DIAGNOSIS — M25532 Pain in left wrist: Secondary | ICD-10-CM | POA: Diagnosis not present

## 2019-10-12 DIAGNOSIS — R202 Paresthesia of skin: Secondary | ICD-10-CM | POA: Diagnosis not present

## 2019-11-16 DIAGNOSIS — G629 Polyneuropathy, unspecified: Secondary | ICD-10-CM | POA: Diagnosis not present

## 2019-11-16 DIAGNOSIS — G5603 Carpal tunnel syndrome, bilateral upper limbs: Secondary | ICD-10-CM | POA: Diagnosis not present

## 2019-11-16 DIAGNOSIS — G5622 Lesion of ulnar nerve, left upper limb: Secondary | ICD-10-CM | POA: Diagnosis not present

## 2019-11-16 DIAGNOSIS — G5613 Other lesions of median nerve, bilateral upper limbs: Secondary | ICD-10-CM | POA: Diagnosis not present

## 2019-11-16 DIAGNOSIS — S63001A Unspecified subluxation of right wrist and hand, initial encounter: Secondary | ICD-10-CM | POA: Diagnosis not present

## 2019-11-26 DIAGNOSIS — N200 Calculus of kidney: Secondary | ICD-10-CM | POA: Diagnosis not present

## 2019-12-21 DIAGNOSIS — M180 Bilateral primary osteoarthritis of first carpometacarpal joints: Secondary | ICD-10-CM | POA: Diagnosis not present

## 2019-12-21 DIAGNOSIS — N200 Calculus of kidney: Secondary | ICD-10-CM | POA: Diagnosis not present

## 2019-12-21 DIAGNOSIS — I10 Essential (primary) hypertension: Secondary | ICD-10-CM | POA: Diagnosis not present

## 2019-12-21 DIAGNOSIS — K449 Diaphragmatic hernia without obstruction or gangrene: Secondary | ICD-10-CM | POA: Diagnosis not present

## 2019-12-21 DIAGNOSIS — G5603 Carpal tunnel syndrome, bilateral upper limbs: Secondary | ICD-10-CM | POA: Diagnosis not present

## 2019-12-25 DIAGNOSIS — Z01812 Encounter for preprocedural laboratory examination: Secondary | ICD-10-CM | POA: Diagnosis not present

## 2019-12-25 DIAGNOSIS — Z20822 Contact with and (suspected) exposure to covid-19: Secondary | ICD-10-CM | POA: Diagnosis not present

## 2019-12-27 DIAGNOSIS — M1990 Unspecified osteoarthritis, unspecified site: Secondary | ICD-10-CM | POA: Diagnosis not present

## 2019-12-27 DIAGNOSIS — I10 Essential (primary) hypertension: Secondary | ICD-10-CM | POA: Diagnosis not present

## 2019-12-27 DIAGNOSIS — G5603 Carpal tunnel syndrome, bilateral upper limbs: Secondary | ICD-10-CM | POA: Diagnosis not present

## 2019-12-27 DIAGNOSIS — Z96651 Presence of right artificial knee joint: Secondary | ICD-10-CM | POA: Diagnosis not present

## 2019-12-27 DIAGNOSIS — Z981 Arthrodesis status: Secondary | ICD-10-CM | POA: Diagnosis not present

## 2019-12-27 DIAGNOSIS — E785 Hyperlipidemia, unspecified: Secondary | ICD-10-CM | POA: Diagnosis not present

## 2019-12-30 DIAGNOSIS — N200 Calculus of kidney: Secondary | ICD-10-CM | POA: Diagnosis not present

## 2019-12-30 DIAGNOSIS — Z87891 Personal history of nicotine dependence: Secondary | ICD-10-CM | POA: Diagnosis not present

## 2019-12-30 DIAGNOSIS — I10 Essential (primary) hypertension: Secondary | ICD-10-CM | POA: Diagnosis not present

## 2019-12-30 DIAGNOSIS — E785 Hyperlipidemia, unspecified: Secondary | ICD-10-CM | POA: Diagnosis not present

## 2019-12-30 DIAGNOSIS — N202 Calculus of kidney with calculus of ureter: Secondary | ICD-10-CM | POA: Diagnosis not present

## 2020-01-05 DIAGNOSIS — N200 Calculus of kidney: Secondary | ICD-10-CM | POA: Diagnosis not present

## 2020-01-05 DIAGNOSIS — Z20822 Contact with and (suspected) exposure to covid-19: Secondary | ICD-10-CM | POA: Diagnosis not present

## 2020-01-05 DIAGNOSIS — N179 Acute kidney failure, unspecified: Secondary | ICD-10-CM | POA: Diagnosis not present

## 2020-01-05 DIAGNOSIS — I10 Essential (primary) hypertension: Secondary | ICD-10-CM | POA: Diagnosis not present

## 2020-01-05 DIAGNOSIS — Z01812 Encounter for preprocedural laboratory examination: Secondary | ICD-10-CM | POA: Diagnosis not present

## 2020-01-05 DIAGNOSIS — G894 Chronic pain syndrome: Secondary | ICD-10-CM | POA: Diagnosis not present

## 2020-01-05 DIAGNOSIS — G5603 Carpal tunnel syndrome, bilateral upper limbs: Secondary | ICD-10-CM | POA: Diagnosis not present

## 2020-01-11 DIAGNOSIS — N200 Calculus of kidney: Secondary | ICD-10-CM | POA: Diagnosis not present

## 2020-01-12 DIAGNOSIS — N200 Calculus of kidney: Secondary | ICD-10-CM | POA: Diagnosis not present

## 2020-01-17 DIAGNOSIS — M25531 Pain in right wrist: Secondary | ICD-10-CM | POA: Diagnosis not present

## 2020-01-17 DIAGNOSIS — M25532 Pain in left wrist: Secondary | ICD-10-CM | POA: Diagnosis not present

## 2020-02-04 DIAGNOSIS — Z96651 Presence of right artificial knee joint: Secondary | ICD-10-CM | POA: Diagnosis not present

## 2020-02-04 DIAGNOSIS — Z791 Long term (current) use of non-steroidal anti-inflammatories (NSAID): Secondary | ICD-10-CM | POA: Diagnosis not present

## 2020-02-04 DIAGNOSIS — I10 Essential (primary) hypertension: Secondary | ICD-10-CM | POA: Diagnosis not present

## 2020-02-04 DIAGNOSIS — Z96653 Presence of artificial knee joint, bilateral: Secondary | ICD-10-CM | POA: Diagnosis not present

## 2020-02-04 DIAGNOSIS — M62562 Muscle wasting and atrophy, not elsewhere classified, left lower leg: Secondary | ICD-10-CM | POA: Diagnosis not present

## 2020-02-04 DIAGNOSIS — E785 Hyperlipidemia, unspecified: Secondary | ICD-10-CM | POA: Diagnosis not present

## 2020-02-04 DIAGNOSIS — M1712 Unilateral primary osteoarthritis, left knee: Secondary | ICD-10-CM | POA: Diagnosis not present

## 2020-02-04 DIAGNOSIS — G5603 Carpal tunnel syndrome, bilateral upper limbs: Secondary | ICD-10-CM | POA: Diagnosis not present

## 2020-02-04 DIAGNOSIS — M25561 Pain in right knee: Secondary | ICD-10-CM | POA: Diagnosis not present

## 2020-02-04 DIAGNOSIS — K219 Gastro-esophageal reflux disease without esophagitis: Secondary | ICD-10-CM | POA: Diagnosis not present

## 2020-02-23 DIAGNOSIS — N200 Calculus of kidney: Secondary | ICD-10-CM | POA: Diagnosis not present

## 2020-02-23 DIAGNOSIS — Z09 Encounter for follow-up examination after completed treatment for conditions other than malignant neoplasm: Secondary | ICD-10-CM | POA: Diagnosis not present

## 2020-04-19 DIAGNOSIS — Z96651 Presence of right artificial knee joint: Secondary | ICD-10-CM | POA: Diagnosis not present

## 2020-04-19 DIAGNOSIS — Z96652 Presence of left artificial knee joint: Secondary | ICD-10-CM | POA: Diagnosis not present

## 2020-04-25 ENCOUNTER — Other Ambulatory Visit: Payer: Self-pay | Admitting: Student

## 2020-04-25 ENCOUNTER — Other Ambulatory Visit (HOSPITAL_COMMUNITY): Payer: Self-pay | Admitting: Student

## 2020-04-25 DIAGNOSIS — Z96651 Presence of right artificial knee joint: Secondary | ICD-10-CM

## 2020-04-27 DIAGNOSIS — N200 Calculus of kidney: Secondary | ICD-10-CM | POA: Diagnosis not present

## 2020-04-28 DIAGNOSIS — Z20822 Contact with and (suspected) exposure to covid-19: Secondary | ICD-10-CM | POA: Diagnosis not present

## 2020-05-09 ENCOUNTER — Encounter (HOSPITAL_COMMUNITY)
Admission: RE | Admit: 2020-05-09 | Discharge: 2020-05-09 | Disposition: A | Discharge status: Home / Self Care | Payer: Medicare Other | Source: Ambulatory Visit | Attending: Student | Admitting: Student

## 2020-05-09 ENCOUNTER — Ambulatory Visit (HOSPITAL_COMMUNITY)
Admission: RE | Admit: 2020-05-09 | Discharge: 2020-05-09 | Disposition: A | Discharge status: Home / Self Care | Payer: Medicare Other | Source: Ambulatory Visit | Attending: Student | Admitting: Student

## 2020-05-09 ENCOUNTER — Other Ambulatory Visit: Payer: Self-pay

## 2020-05-09 DIAGNOSIS — M25562 Pain in left knee: Secondary | ICD-10-CM | POA: Diagnosis not present

## 2020-05-09 DIAGNOSIS — Z471 Aftercare following joint replacement surgery: Secondary | ICD-10-CM | POA: Diagnosis not present

## 2020-05-09 DIAGNOSIS — Z96651 Presence of right artificial knee joint: Secondary | ICD-10-CM | POA: Insufficient documentation

## 2020-05-09 DIAGNOSIS — M25561 Pain in right knee: Secondary | ICD-10-CM | POA: Diagnosis not present

## 2020-05-09 MED ORDER — TECHNETIUM TC 99M MEDRONATE IV KIT
20.0000 | PACK | Freq: Once | INTRAVENOUS | Status: AC | PRN
  Administered 2020-05-09: 20 via INTRAVENOUS

## 2020-05-19 DIAGNOSIS — M961 Postlaminectomy syndrome, not elsewhere classified: Secondary | ICD-10-CM | POA: Diagnosis not present

## 2020-05-19 DIAGNOSIS — M4316 Spondylolisthesis, lumbar region: Secondary | ICD-10-CM | POA: Diagnosis not present

## 2020-05-19 DIAGNOSIS — G894 Chronic pain syndrome: Secondary | ICD-10-CM | POA: Diagnosis not present

## 2020-05-19 DIAGNOSIS — R7989 Other specified abnormal findings of blood chemistry: Secondary | ICD-10-CM | POA: Diagnosis not present

## 2020-05-24 DIAGNOSIS — Z471 Aftercare following joint replacement surgery: Secondary | ICD-10-CM | POA: Diagnosis not present

## 2020-05-24 DIAGNOSIS — Z96651 Presence of right artificial knee joint: Secondary | ICD-10-CM | POA: Diagnosis not present

## 2020-06-15 ENCOUNTER — Other Ambulatory Visit
Admission: RE | Admit: 2020-06-15 | Discharge: 2020-06-15 | Disposition: A | Discharge status: Home / Self Care | Payer: Medicare Other | Source: Ambulatory Visit | Attending: Family Medicine | Admitting: Family Medicine

## 2020-06-15 DIAGNOSIS — N200 Calculus of kidney: Secondary | ICD-10-CM | POA: Diagnosis not present

## 2020-06-15 LAB — CREATININE, SERUM
Creatinine, Ser: 1.21 mg/dL (ref 0.61–1.24)
GFR, Estimated: >60 mL/min (ref >60)

## 2020-06-15 LAB — HEMOGLOBIN: Hemoglobin: 11.4 g/dL — ABNORMAL LOW (ref 13.0–17.0)

## 2020-06-16 LAB — URINE CULTURE: Culture: NO GROWTH

## 2020-06-22 DIAGNOSIS — K219 Gastro-esophageal reflux disease without esophagitis: Secondary | ICD-10-CM | POA: Diagnosis not present

## 2020-06-22 DIAGNOSIS — F32A Depression, unspecified: Secondary | ICD-10-CM | POA: Diagnosis not present

## 2020-06-22 DIAGNOSIS — Z79899 Other long term (current) drug therapy: Secondary | ICD-10-CM | POA: Diagnosis not present

## 2020-06-22 DIAGNOSIS — Z96651 Presence of right artificial knee joint: Secondary | ICD-10-CM | POA: Diagnosis not present

## 2020-06-22 DIAGNOSIS — F17210 Nicotine dependence, cigarettes, uncomplicated: Secondary | ICD-10-CM | POA: Diagnosis not present

## 2020-06-22 DIAGNOSIS — Z981 Arthrodesis status: Secondary | ICD-10-CM | POA: Diagnosis not present

## 2020-06-22 DIAGNOSIS — M1990 Unspecified osteoarthritis, unspecified site: Secondary | ICD-10-CM | POA: Diagnosis not present

## 2020-06-22 DIAGNOSIS — K449 Diaphragmatic hernia without obstruction or gangrene: Secondary | ICD-10-CM | POA: Diagnosis not present

## 2020-06-22 DIAGNOSIS — I10 Essential (primary) hypertension: Secondary | ICD-10-CM | POA: Diagnosis not present

## 2020-06-22 DIAGNOSIS — R0602 Shortness of breath: Secondary | ICD-10-CM | POA: Diagnosis not present

## 2020-06-22 DIAGNOSIS — N200 Calculus of kidney: Secondary | ICD-10-CM | POA: Diagnosis not present

## 2020-06-22 DIAGNOSIS — E785 Hyperlipidemia, unspecified: Secondary | ICD-10-CM | POA: Diagnosis not present

## 2020-07-06 DIAGNOSIS — N200 Calculus of kidney: Secondary | ICD-10-CM | POA: Diagnosis not present

## 2020-07-06 DIAGNOSIS — Z466 Encounter for fitting and adjustment of urinary device: Secondary | ICD-10-CM | POA: Diagnosis not present

## 2020-07-31 DIAGNOSIS — R39198 Other difficulties with micturition: Secondary | ICD-10-CM | POA: Diagnosis not present

## 2020-07-31 DIAGNOSIS — N200 Calculus of kidney: Secondary | ICD-10-CM | POA: Diagnosis not present

## 2020-08-01 DIAGNOSIS — M1120 Other chondrocalcinosis, unspecified site: Secondary | ICD-10-CM | POA: Diagnosis not present

## 2020-08-01 DIAGNOSIS — M19042 Primary osteoarthritis, left hand: Secondary | ICD-10-CM | POA: Diagnosis not present

## 2020-08-01 DIAGNOSIS — M19041 Primary osteoarthritis, right hand: Secondary | ICD-10-CM | POA: Diagnosis not present

## 2020-08-01 DIAGNOSIS — M25532 Pain in left wrist: Secondary | ICD-10-CM | POA: Diagnosis not present

## 2020-08-01 DIAGNOSIS — M25531 Pain in right wrist: Secondary | ICD-10-CM | POA: Diagnosis not present

## 2020-08-01 DIAGNOSIS — Z87891 Personal history of nicotine dependence: Secondary | ICD-10-CM | POA: Diagnosis not present

## 2020-08-01 DIAGNOSIS — I10 Essential (primary) hypertension: Secondary | ICD-10-CM | POA: Diagnosis not present

## 2020-08-01 DIAGNOSIS — M79642 Pain in left hand: Secondary | ICD-10-CM | POA: Diagnosis not present

## 2020-08-01 DIAGNOSIS — M119 Crystal arthropathy, unspecified: Secondary | ICD-10-CM | POA: Diagnosis not present

## 2020-08-01 DIAGNOSIS — M6580 Other synovitis and tenosynovitis, unspecified site: Secondary | ICD-10-CM | POA: Diagnosis not present

## 2020-11-14 DIAGNOSIS — R7989 Other specified abnormal findings of blood chemistry: Secondary | ICD-10-CM | POA: Diagnosis not present

## 2020-11-14 DIAGNOSIS — F17200 Nicotine dependence, unspecified, uncomplicated: Secondary | ICD-10-CM | POA: Diagnosis not present

## 2020-11-14 DIAGNOSIS — Z9181 History of falling: Secondary | ICD-10-CM | POA: Diagnosis not present

## 2020-11-14 DIAGNOSIS — K219 Gastro-esophageal reflux disease without esophagitis: Secondary | ICD-10-CM | POA: Diagnosis not present

## 2020-11-14 DIAGNOSIS — M5134 Other intervertebral disc degeneration, thoracic region: Secondary | ICD-10-CM | POA: Diagnosis not present

## 2020-11-14 DIAGNOSIS — M438X5 Other specified deforming dorsopathies, thoracolumbar region: Secondary | ICD-10-CM | POA: Diagnosis not present

## 2020-11-14 DIAGNOSIS — D649 Anemia, unspecified: Secondary | ICD-10-CM | POA: Diagnosis not present

## 2020-11-14 DIAGNOSIS — M2578 Osteophyte, vertebrae: Secondary | ICD-10-CM | POA: Diagnosis not present

## 2020-11-14 DIAGNOSIS — M532X6 Spinal instabilities, lumbar region: Secondary | ICD-10-CM | POA: Diagnosis not present

## 2020-11-14 DIAGNOSIS — N200 Calculus of kidney: Secondary | ICD-10-CM | POA: Diagnosis not present

## 2020-11-14 DIAGNOSIS — I10 Essential (primary) hypertension: Secondary | ICD-10-CM | POA: Diagnosis not present

## 2021-01-16 DIAGNOSIS — D649 Anemia, unspecified: Secondary | ICD-10-CM | POA: Diagnosis not present

## 2021-01-16 DIAGNOSIS — M532X6 Spinal instabilities, lumbar region: Secondary | ICD-10-CM | POA: Diagnosis not present

## 2021-01-16 DIAGNOSIS — S22080D Wedge compression fracture of T11-T12 vertebra, subsequent encounter for fracture with routine healing: Secondary | ICD-10-CM | POA: Diagnosis not present

## 2021-01-16 DIAGNOSIS — M816 Localized osteoporosis [Lequesne]: Secondary | ICD-10-CM | POA: Diagnosis not present

## 2021-02-14 DIAGNOSIS — R918 Other nonspecific abnormal finding of lung field: Secondary | ICD-10-CM | POA: Diagnosis not present

## 2021-02-14 DIAGNOSIS — I10 Essential (primary) hypertension: Secondary | ICD-10-CM | POA: Diagnosis not present

## 2021-02-14 DIAGNOSIS — Z5181 Encounter for therapeutic drug level monitoring: Secondary | ICD-10-CM | POA: Diagnosis not present

## 2021-02-14 DIAGNOSIS — K449 Diaphragmatic hernia without obstruction or gangrene: Secondary | ICD-10-CM | POA: Diagnosis not present

## 2021-02-14 DIAGNOSIS — D509 Iron deficiency anemia, unspecified: Secondary | ICD-10-CM | POA: Diagnosis not present

## 2021-03-16 DIAGNOSIS — M47812 Spondylosis without myelopathy or radiculopathy, cervical region: Secondary | ICD-10-CM | POA: Diagnosis not present

## 2021-03-16 DIAGNOSIS — M4300 Spondylolysis, site unspecified: Secondary | ICD-10-CM | POA: Diagnosis not present

## 2021-03-16 DIAGNOSIS — M4327 Fusion of spine, lumbosacral region: Secondary | ICD-10-CM | POA: Diagnosis not present

## 2021-03-16 DIAGNOSIS — M5137 Other intervertebral disc degeneration, lumbosacral region: Secondary | ICD-10-CM | POA: Diagnosis not present

## 2021-03-16 DIAGNOSIS — Z981 Arthrodesis status: Secondary | ICD-10-CM | POA: Diagnosis not present

## 2021-03-16 DIAGNOSIS — M4857XD Collapsed vertebra, not elsewhere classified, lumbosacral region, subsequent encounter for fracture with routine healing: Secondary | ICD-10-CM | POA: Diagnosis not present

## 2021-03-16 DIAGNOSIS — M545 Low back pain, unspecified: Secondary | ICD-10-CM | POA: Diagnosis not present

## 2021-03-16 DIAGNOSIS — Z9889 Other specified postprocedural states: Secondary | ICD-10-CM | POA: Diagnosis not present

## 2021-03-31 DIAGNOSIS — M5124 Other intervertebral disc displacement, thoracic region: Secondary | ICD-10-CM | POA: Diagnosis not present

## 2021-03-31 DIAGNOSIS — K449 Diaphragmatic hernia without obstruction or gangrene: Secondary | ICD-10-CM | POA: Diagnosis not present

## 2021-03-31 DIAGNOSIS — M5125 Other intervertebral disc displacement, thoracolumbar region: Secondary | ICD-10-CM | POA: Diagnosis not present

## 2021-03-31 DIAGNOSIS — M419 Scoliosis, unspecified: Secondary | ICD-10-CM | POA: Diagnosis not present

## 2021-03-31 DIAGNOSIS — M545 Low back pain, unspecified: Secondary | ICD-10-CM | POA: Diagnosis not present

## 2021-04-11 DIAGNOSIS — E611 Iron deficiency: Secondary | ICD-10-CM | POA: Diagnosis not present

## 2021-04-11 DIAGNOSIS — K2950 Unspecified chronic gastritis without bleeding: Secondary | ICD-10-CM | POA: Diagnosis not present

## 2021-04-11 DIAGNOSIS — E785 Hyperlipidemia, unspecified: Secondary | ICD-10-CM | POA: Diagnosis not present

## 2021-04-11 DIAGNOSIS — D509 Iron deficiency anemia, unspecified: Secondary | ICD-10-CM | POA: Diagnosis not present

## 2021-04-11 DIAGNOSIS — K5730 Diverticulosis of large intestine without perforation or abscess without bleeding: Secondary | ICD-10-CM | POA: Diagnosis not present

## 2021-04-11 DIAGNOSIS — K449 Diaphragmatic hernia without obstruction or gangrene: Secondary | ICD-10-CM | POA: Diagnosis not present

## 2021-04-11 DIAGNOSIS — K3189 Other diseases of stomach and duodenum: Secondary | ICD-10-CM | POA: Diagnosis not present

## 2021-04-11 DIAGNOSIS — I129 Hypertensive chronic kidney disease with stage 1 through stage 4 chronic kidney disease, or unspecified chronic kidney disease: Secondary | ICD-10-CM | POA: Diagnosis not present

## 2021-04-11 DIAGNOSIS — N189 Chronic kidney disease, unspecified: Secondary | ICD-10-CM | POA: Diagnosis not present

## 2021-04-11 DIAGNOSIS — M109 Gout, unspecified: Secondary | ICD-10-CM | POA: Diagnosis not present

## 2021-04-25 DIAGNOSIS — M40204 Unspecified kyphosis, thoracic region: Secondary | ICD-10-CM | POA: Diagnosis not present

## 2021-04-25 DIAGNOSIS — Z981 Arthrodesis status: Secondary | ICD-10-CM | POA: Diagnosis not present

## 2021-04-25 DIAGNOSIS — M5184 Other intervertebral disc disorders, thoracic region: Secondary | ICD-10-CM | POA: Diagnosis not present

## 2021-04-25 DIAGNOSIS — M438X4 Other specified deforming dorsopathies, thoracic region: Secondary | ICD-10-CM | POA: Diagnosis not present

## 2021-04-25 DIAGNOSIS — S22080D Wedge compression fracture of T11-T12 vertebra, subsequent encounter for fracture with routine healing: Secondary | ICD-10-CM | POA: Diagnosis not present

## 2021-04-25 DIAGNOSIS — M4854XD Collapsed vertebra, not elsewhere classified, thoracic region, subsequent encounter for fracture with routine healing: Secondary | ICD-10-CM | POA: Diagnosis not present

## 2021-05-10 DIAGNOSIS — Z79891 Long term (current) use of opiate analgesic: Secondary | ICD-10-CM | POA: Diagnosis not present

## 2021-06-20 DIAGNOSIS — M109 Gout, unspecified: Secondary | ICD-10-CM | POA: Diagnosis not present

## 2021-06-20 DIAGNOSIS — M40204 Unspecified kyphosis, thoracic region: Secondary | ICD-10-CM | POA: Diagnosis not present

## 2021-06-20 DIAGNOSIS — G2581 Restless legs syndrome: Secondary | ICD-10-CM | POA: Diagnosis not present

## 2021-06-20 DIAGNOSIS — I444 Left anterior fascicular block: Secondary | ICD-10-CM | POA: Diagnosis not present

## 2021-06-20 DIAGNOSIS — I10 Essential (primary) hypertension: Secondary | ICD-10-CM | POA: Diagnosis not present

## 2021-06-20 DIAGNOSIS — F17200 Nicotine dependence, unspecified, uncomplicated: Secondary | ICD-10-CM | POA: Diagnosis not present

## 2021-06-20 DIAGNOSIS — M4005 Postural kyphosis, thoracolumbar region: Secondary | ICD-10-CM | POA: Diagnosis not present

## 2021-06-20 DIAGNOSIS — Z981 Arthrodesis status: Secondary | ICD-10-CM | POA: Diagnosis not present

## 2021-06-20 DIAGNOSIS — K219 Gastro-esophageal reflux disease without esophagitis: Secondary | ICD-10-CM | POA: Diagnosis not present

## 2021-06-20 DIAGNOSIS — M4004 Postural kyphosis, thoracic region: Secondary | ICD-10-CM | POA: Diagnosis not present

## 2021-06-20 DIAGNOSIS — F32A Depression, unspecified: Secondary | ICD-10-CM | POA: Diagnosis not present

## 2021-06-20 DIAGNOSIS — E785 Hyperlipidemia, unspecified: Secondary | ICD-10-CM | POA: Diagnosis not present

## 2021-06-20 DIAGNOSIS — D649 Anemia, unspecified: Secondary | ICD-10-CM | POA: Diagnosis not present

## 2021-06-20 DIAGNOSIS — M4804 Spinal stenosis, thoracic region: Secondary | ICD-10-CM | POA: Diagnosis not present

## 2021-06-20 DIAGNOSIS — M4805 Spinal stenosis, thoracolumbar region: Secondary | ICD-10-CM | POA: Diagnosis not present

## 2021-06-20 DIAGNOSIS — S22080D Wedge compression fracture of T11-T12 vertebra, subsequent encounter for fracture with routine healing: Secondary | ICD-10-CM | POA: Diagnosis not present

## 2021-06-20 DIAGNOSIS — M4854XD Collapsed vertebra, not elsewhere classified, thoracic region, subsequent encounter for fracture with routine healing: Secondary | ICD-10-CM | POA: Diagnosis not present

## 2021-06-20 DIAGNOSIS — S22089A Unspecified fracture of T11-T12 vertebra, initial encounter for closed fracture: Secondary | ICD-10-CM | POA: Diagnosis not present

## 2021-06-20 DIAGNOSIS — S22080A Wedge compression fracture of T11-T12 vertebra, initial encounter for closed fracture: Secondary | ICD-10-CM | POA: Diagnosis not present

## 2021-06-28 DIAGNOSIS — M963 Postlaminectomy kyphosis: Secondary | ICD-10-CM | POA: Diagnosis not present

## 2021-06-28 DIAGNOSIS — M40204 Unspecified kyphosis, thoracic region: Secondary | ICD-10-CM | POA: Diagnosis not present

## 2021-06-28 DIAGNOSIS — M4805 Spinal stenosis, thoracolumbar region: Secondary | ICD-10-CM | POA: Diagnosis not present

## 2021-06-28 DIAGNOSIS — Z981 Arthrodesis status: Secondary | ICD-10-CM | POA: Diagnosis not present

## 2021-06-28 DIAGNOSIS — F17200 Nicotine dependence, unspecified, uncomplicated: Secondary | ICD-10-CM | POA: Diagnosis not present

## 2021-06-28 DIAGNOSIS — M4004 Postural kyphosis, thoracic region: Secondary | ICD-10-CM | POA: Diagnosis not present

## 2021-06-28 DIAGNOSIS — E785 Hyperlipidemia, unspecified: Secondary | ICD-10-CM | POA: Diagnosis not present

## 2021-06-28 DIAGNOSIS — M4005 Postural kyphosis, thoracolumbar region: Secondary | ICD-10-CM | POA: Diagnosis not present

## 2021-06-28 DIAGNOSIS — M4854XD Collapsed vertebra, not elsewhere classified, thoracic region, subsequent encounter for fracture with routine healing: Secondary | ICD-10-CM | POA: Diagnosis not present

## 2021-06-28 DIAGNOSIS — S22080D Wedge compression fracture of T11-T12 vertebra, subsequent encounter for fracture with routine healing: Secondary | ICD-10-CM | POA: Diagnosis not present

## 2021-06-28 DIAGNOSIS — D649 Anemia, unspecified: Secondary | ICD-10-CM | POA: Diagnosis not present

## 2021-06-28 DIAGNOSIS — M4804 Spinal stenosis, thoracic region: Secondary | ICD-10-CM | POA: Diagnosis not present

## 2021-06-28 DIAGNOSIS — Z472 Encounter for removal of internal fixation device: Secondary | ICD-10-CM | POA: Diagnosis not present

## 2021-06-28 DIAGNOSIS — K219 Gastro-esophageal reflux disease without esophagitis: Secondary | ICD-10-CM | POA: Diagnosis not present

## 2021-06-28 DIAGNOSIS — F32A Depression, unspecified: Secondary | ICD-10-CM | POA: Diagnosis not present

## 2021-06-28 DIAGNOSIS — I10 Essential (primary) hypertension: Secondary | ICD-10-CM | POA: Diagnosis not present

## 2021-06-28 DIAGNOSIS — G2581 Restless legs syndrome: Secondary | ICD-10-CM | POA: Diagnosis not present

## 2021-06-28 DIAGNOSIS — M109 Gout, unspecified: Secondary | ICD-10-CM | POA: Diagnosis not present

## 2021-07-13 DIAGNOSIS — Z4802 Encounter for removal of sutures: Secondary | ICD-10-CM | POA: Diagnosis not present

## 2021-07-30 DIAGNOSIS — M79671 Pain in right foot: Secondary | ICD-10-CM | POA: Diagnosis not present

## 2021-07-30 DIAGNOSIS — Z9889 Other specified postprocedural states: Secondary | ICD-10-CM | POA: Diagnosis not present

## 2021-07-30 DIAGNOSIS — M955 Acquired deformity of pelvis: Secondary | ICD-10-CM | POA: Diagnosis not present

## 2021-07-30 DIAGNOSIS — Z79891 Long term (current) use of opiate analgesic: Secondary | ICD-10-CM | POA: Diagnosis not present

## 2021-07-30 DIAGNOSIS — Z9689 Presence of other specified functional implants: Secondary | ICD-10-CM | POA: Diagnosis not present

## 2021-07-30 DIAGNOSIS — M170 Bilateral primary osteoarthritis of knee: Secondary | ICD-10-CM | POA: Diagnosis not present

## 2021-07-30 DIAGNOSIS — Z4789 Encounter for other orthopedic aftercare: Secondary | ICD-10-CM | POA: Diagnosis not present

## 2021-07-30 DIAGNOSIS — M79672 Pain in left foot: Secondary | ICD-10-CM | POA: Diagnosis not present

## 2021-07-30 DIAGNOSIS — Z981 Arthrodesis status: Secondary | ICD-10-CM | POA: Diagnosis not present

## 2021-09-12 DIAGNOSIS — M546 Pain in thoracic spine: Secondary | ICD-10-CM | POA: Diagnosis not present

## 2021-09-12 DIAGNOSIS — M8588 Other specified disorders of bone density and structure, other site: Secondary | ICD-10-CM | POA: Diagnosis not present

## 2021-09-12 DIAGNOSIS — M438X4 Other specified deforming dorsopathies, thoracic region: Secondary | ICD-10-CM | POA: Diagnosis not present

## 2021-09-12 DIAGNOSIS — Z4789 Encounter for other orthopedic aftercare: Secondary | ICD-10-CM | POA: Diagnosis not present

## 2021-09-12 DIAGNOSIS — M549 Dorsalgia, unspecified: Secondary | ICD-10-CM | POA: Diagnosis not present

## 2021-09-12 DIAGNOSIS — M545 Low back pain, unspecified: Secondary | ICD-10-CM | POA: Diagnosis not present

## 2021-09-12 DIAGNOSIS — Z981 Arthrodesis status: Secondary | ICD-10-CM | POA: Diagnosis not present

## 2021-10-10 DIAGNOSIS — N200 Calculus of kidney: Secondary | ICD-10-CM | POA: Diagnosis not present

## 2021-10-10 DIAGNOSIS — R799 Abnormal finding of blood chemistry, unspecified: Secondary | ICD-10-CM | POA: Diagnosis not present

## 2021-10-10 DIAGNOSIS — Z13220 Encounter for screening for lipoid disorders: Secondary | ICD-10-CM | POA: Diagnosis not present

## 2021-10-10 DIAGNOSIS — D649 Anemia, unspecified: Secondary | ICD-10-CM | POA: Diagnosis not present

## 2021-10-10 DIAGNOSIS — I10 Essential (primary) hypertension: Secondary | ICD-10-CM | POA: Diagnosis not present

## 2021-10-10 DIAGNOSIS — K449 Diaphragmatic hernia without obstruction or gangrene: Secondary | ICD-10-CM | POA: Diagnosis not present

## 2021-10-10 DIAGNOSIS — M25561 Pain in right knee: Secondary | ICD-10-CM | POA: Diagnosis not present

## 2021-10-10 DIAGNOSIS — Z131 Encounter for screening for diabetes mellitus: Secondary | ICD-10-CM | POA: Diagnosis not present

## 2021-10-10 DIAGNOSIS — Z96653 Presence of artificial knee joint, bilateral: Secondary | ICD-10-CM | POA: Diagnosis not present

## 2021-10-10 DIAGNOSIS — R7989 Other specified abnormal findings of blood chemistry: Secondary | ICD-10-CM | POA: Diagnosis not present

## 2021-10-10 DIAGNOSIS — D509 Iron deficiency anemia, unspecified: Secondary | ICD-10-CM | POA: Diagnosis not present

## 2021-10-26 DIAGNOSIS — Z96653 Presence of artificial knee joint, bilateral: Secondary | ICD-10-CM | POA: Diagnosis not present

## 2021-10-26 DIAGNOSIS — Z96651 Presence of right artificial knee joint: Secondary | ICD-10-CM | POA: Diagnosis not present

## 2021-11-13 NOTE — Progress Notes (Addendum)
Anesthesia Review: ? ?PCP:DR Otilio Miu at [facility]  ?Cardiologist : none  ?Chest x-ray : ?EKG : ?Echo : ?Stress test: ?Cardiac Cath :  ?Activity level: cannot do a flight of stairs without difficulty  ?Sleep Study/ CPAP : none  ?Fasting Blood Sugar :      / Checks Blood Sugar -- times a day:   ?Blood Thinner/ Instructions /Last Dose: ?ASA / Instructions/ Last Dose :   ?Pt was one hour late for preop appt.  Pt arrived at 3pm in dept.  Vital signs, labs and medical hx and preop instructions were completed.   ?Smoker  ?PT has not been on htn meds in at least 4 months per pt.  PT stated his PCP took off of blood pressure medication.   ?

## 2021-11-13 NOTE — Progress Notes (Addendum)
DUE TO COVID-19 ONLY ONE VISITOR IS ALLOWED TO COME WITH YOU AND STAY IN THE WAITING ROOM ONLY DURING PRE OP AND PROCEDURE DAY OF SURGERY.  2 VISITOR  MAY VISIT WITH YOU AFTER SURGERY IN YOUR PRIVATE ROOM DURING VISITING HOURS ONLY! ?YOU MAY HAVE ONE PERSON SPEND THE NITE WITH YOU IN YOUR ROOM AFTER SURGERY.   ? ? ? Your procedure is scheduled on:  ? 3/29/.23  ? Report to [facility] Main  Entrance ? ? Report to admitting at    100pm ?DO NOT BRING INSURANCE CARD, PICTURE ID OR WALLET DAY OF SURGERY.  ?  ? ? Call this number if you have problems the morning of surgery [phone]  ? ? REMEMBER: NO  SOLID FOODS , CANDY, GUM OR MINTS AFTER MIDNITE THE NITE BEFORE SURGERY .       Marland Kitchen CLEAR LIQUIDS UNTIL         1250pm        DAY OF SURGERY.      PLEASE FINISH ENSURE DRINK PER SURGEON ORDER  WHICH NEEDS TO BE COMPLETED AT  1250 pm         day OF SURGERY.   ? ? ? ? ?CLEAR LIQUID DIET ? ? ?Foods Allowed      ?WATER ?BLACK COFFEE ( SUGAR OK, NO MILK, CREAM OR CREAMER) REGULAR AND DECAF  ?TEA ( SUGAR OK NO MILK, CREAM, OR CREAMER) REGULAR AND DECAF  ?PLAIN JELLO ( NO RED)  ?FRUIT ICES ( NO RED, NO FRUIT PULP)  ?POPSICLES ( NO RED)  ?JUICE- APPLE, WHITE GRAPE AND WHITE CRANBERRY  ?SPORT DRINK LIKE GATORADE ( NO RED)  ?CLEAR BROTH ( VEGETABLE , CHICKEN OR BEEF)                                                               ? ?    ? ?BRUSH YOUR TEETH MORNING OF SURGERY AND RINSE YOUR MOUTH OUT, NO CHEWING GUM CANDY OR MINTS. ?  ? ? Take these medicines the morning of surgery with A SIP OF WATER:  wellbutrin, cymbalta, mirapex, lyrica  ? ? ?DO NOT TAKE ANY DIABETIC MEDICATIONS DAY OF YOUR SURGERY ?                  ?            You may not have any metal on your body including hair pins and  ?            piercings  Do not wear jewelry, make-up, lotions, powders or perfumes, deodorant ?            Do not wear nail polish on your fingernails.   ?           IF YOU ARE A MALE AND WANT TO SHAVE UNDER ARMS OR LEGS PRIOR TO  SURGERY YOU MUST DO SO AT LEAST 48 HOURS PRIOR TO SURGERY.  ?            Men may shave face and neck. ? ? Do not bring valuables to the hospital. [place] IS NOT ?            RESPONSIBLE   FOR VALUABLES. ? Contacts, dentures or bridgework may not be worn  into surgery. ? Leave suitcase in the car. After surgery it may be brought to your room. ? ?  ? Patients discharged the day of surgery will not be allowed to drive home. IF YOU ARE HAVING SURGERY AND GOING HOME THE SAME DAY, YOU MUST HAVE AN ADULT TO DRIVE YOU HOME AND BE WITH YOU FOR 24 HOURS. YOU MAY GO HOME BY TAXI OR UBER OR ORTHERWISE, BUT AN ADULT MUST ACCOMPANY YOU HOME AND STAY WITH YOU FOR 24 HOURS. ?  ? ?            Please read over the following fact sheets you were given: ?_____________________________________________________________________ ? ?[place] - Preparing for Surgery ?Before surgery, you can play an important role.  Because skin is not sterile, your skin needs to be as free of germs as possible.  You can reduce the number of germs on your skin by washing with CHG (chlorahexidine gluconate) soap before surgery.  CHG is an antiseptic cleaner which kills germs and bonds with the skin to continue killing germs even after washing. ?Please DO NOT use if you have an allergy to CHG or antibacterial soaps.  If your skin becomes reddened/irritated stop using the CHG and inform your nurse when you arrive at Short Stay. ?Do not shave (including legs and underarms) for at least 48 hours prior to the first CHG shower.  You may shave your face/neck. ?Please follow these instructions carefully: ? 1.  Shower with CHG Soap the night before surgery and the  morning of Surgery. ? 2.  If you choose to wash your hair, wash your hair first as usual with your  normal  shampoo. ? 3.  After you shampoo, rinse your hair and body thoroughly to remove the  shampoo.                           4.  Use CHG as you would any other liquid soap.  You can apply chg directly   to the skin and wash  ?                     Gently with a scrungie or clean washcloth. ? 5.  Apply the CHG Soap to your body ONLY FROM THE NECK DOWN.   Do not use on face/ open      ?                     Wound or open sores. Avoid contact with eyes, ears mouth and genitals (private parts).  ?                     Engineering geologist,  Genitals (private parts) with your normal soap. ?            6.  Wash thoroughly, paying special attention to the area where your surgery  will be performed. ? 7.  Thoroughly rinse your body with warm water from the neck down. ? 8.  DO NOT shower/wash with your normal soap after using and rinsing off  the CHG Soap. ?               9.  Pat yourself dry with a clean towel. ?           10.  Wear clean pajamas. ?           11.  Place clean sheets on your bed the night  of your first shower and do not  sleep with pets. ?Day of Surgery : ?Do not apply any lotions/deodorants the morning of surgery.  Please wear clean clothes to the hospital/surgery center. ? ?FAILURE TO FOLLOW THESE INSTRUCTIONS MAY RESULT IN THE CANCELLATION OF YOUR SURGERY ?PATIENT SIGNATURE_________________________________ ? ?NURSE SIGNATURE__________________________________ ? ?________________________________________________________________________  ? ? ?           ?

## 2021-11-14 ENCOUNTER — Other Ambulatory Visit (HOSPITAL_COMMUNITY): Payer: Self-pay

## 2021-11-15 ENCOUNTER — Encounter (HOSPITAL_COMMUNITY)
Admission: RE | Admit: 2021-11-15 | Discharge: 2021-11-15 | Disposition: A | Discharge status: Home / Self Care | Payer: Medicare Other | Source: Ambulatory Visit | Attending: Orthopedic Surgery | Admitting: Orthopedic Surgery

## 2021-11-15 ENCOUNTER — Other Ambulatory Visit: Payer: Self-pay

## 2021-11-15 ENCOUNTER — Encounter (HOSPITAL_COMMUNITY): Payer: Self-pay

## 2021-11-15 VITALS — BP 152/82 | HR 74 | Temp 98.4°F | Resp 18 | Ht 69.0 in | Wt 196.0 lb

## 2021-11-15 DIAGNOSIS — T84012A Broken internal right knee prosthesis, initial encounter: Secondary | ICD-10-CM | POA: Insufficient documentation

## 2021-11-15 DIAGNOSIS — Z01812 Encounter for preprocedural laboratory examination: Secondary | ICD-10-CM | POA: Diagnosis not present

## 2021-11-15 DIAGNOSIS — Y838 Other surgical procedures as the cause of abnormal reaction of the patient, or of later complication, without mention of misadventure at the time of the procedure: Secondary | ICD-10-CM | POA: Diagnosis not present

## 2021-11-15 DIAGNOSIS — Z96652 Presence of left artificial knee joint: Secondary | ICD-10-CM | POA: Diagnosis not present

## 2021-11-15 DIAGNOSIS — Z96651 Presence of right artificial knee joint: Secondary | ICD-10-CM | POA: Diagnosis not present

## 2021-11-15 DIAGNOSIS — Z01818 Encounter for other preprocedural examination: Secondary | ICD-10-CM

## 2021-11-15 HISTORY — DX: Anemia, unspecified: D64.9

## 2021-11-15 HISTORY — DX: Personal history of urinary calculi: Z87.442

## 2021-11-15 HISTORY — DX: Unspecified osteoarthritis, unspecified site: M19.90

## 2021-11-15 HISTORY — DX: Cardiac murmur, unspecified: R01.1

## 2021-11-15 HISTORY — DX: Dyspnea, unspecified: R06.00

## 2021-11-15 HISTORY — DX: Gastro-esophageal reflux disease without esophagitis: K21.9

## 2021-11-15 LAB — SURGICAL PCR SCREEN
MRSA, PCR: NEGATIVE
Staphylococcus aureus: POSITIVE — AB

## 2021-11-15 LAB — CBC
HCT: 36.8 % — ABNORMAL LOW (ref 39.0–52.0)
Hemoglobin: 11.5 g/dL — ABNORMAL LOW (ref 13.0–17.0)
MCH: 28.3 pg (ref 26.0–34.0)
MCHC: 31.3 g/dL (ref 30.0–36.0)
MCV: 90.4 fL (ref 80.0–100.0)
Platelets: 347 10*3/uL (ref 150–400)
RBC: 4.07 MIL/uL — ABNORMAL LOW (ref 4.22–5.81)
RDW: 23.5 % — ABNORMAL HIGH (ref 11.5–15.5)
WBC: 8.7 10*3/uL (ref 4.0–10.5)
nRBC: 0 % (ref 0.0–0.2)

## 2021-11-15 LAB — COMPREHENSIVE METABOLIC PANEL
ALT: 34 U/L (ref 0–44)
AST: 23 U/L (ref 15–41)
Albumin: 3.8 g/dL (ref 3.5–5.0)
Alkaline Phosphatase: 133 U/L — ABNORMAL HIGH (ref 38–126)
Anion gap: 9 (ref 5–15)
BUN: 44 mg/dL — ABNORMAL HIGH (ref 8–23)
CO2: 24 mmol/L (ref 22–32)
Calcium: 9 mg/dL (ref 8.9–10.3)
Chloride: 103 mmol/L (ref 98–111)
Creatinine, Ser: 1.22 mg/dL (ref 0.61–1.24)
GFR, Estimated: >60 mL/min (ref >60)
Glucose, Bld: 96 mg/dL (ref 70–99)
Potassium: 4.4 mmol/L (ref 3.5–5.1)
Sodium: 136 mmol/L (ref 135–145)
Total Bilirubin: 0.3 mg/dL (ref 0.3–1.2)
Total Protein: 8.2 g/dL — ABNORMAL HIGH (ref 6.5–8.1)

## 2021-11-20 NOTE — Anesthesia Preprocedure Evaluation (Addendum)
Anesthesia Evaluation  ?Patient identified by MRN, date of birth, ID band ?Patient awake ? ? ? ?Reviewed: ?Allergy & Precautions, NPO status , Patient's Chart, lab work & pertinent test results, reviewed documented beta blocker date and time  ? ?Airway ?Mallampati: II ? ?TM Distance: >3 FB ?Neck ROM: Full ? ? ? Dental ? ?(+) Dental Advisory Given ?  ?Pulmonary ?shortness of breath and with exertion, Current Smoker,  ?  ?Pulmonary exam normal ?breath sounds clear to auscultation ? ? ? ? ? ? Cardiovascular ?hypertension, Normal cardiovascular exam ?Rhythm:Regular Rate:Normal ? ?Not currently on any HTN Rx ?  ?Neuro/Psych ?PSYCHIATRIC DISORDERS Depression ADDnegative neurological ROS ?   ? GI/Hepatic ?Neg liver ROS, GERD  Medicated and Controlled,  ?Endo/Other  ?negative endocrine ROS ? Renal/GU ?Renal diseaseHx/o renal calculi  ?negative genitourinary ?  ?Musculoskeletal ? ?(+) Arthritis , Osteoarthritis,  Failed right total knee arthroplasty ?Post laminectomy syndrome ?Chronic low back pain  ? Abdominal ?  ?Peds ? Hematology ? ?(+) Blood dyscrasia, anemia ,   ?Anesthesia Other Findings ? ? Reproductive/Obstetrics ?ED ? ?  ? ? ? ? ? ? ? ? ? ? ? ? ? ?  ?  ? ? ? ? ? ? ?Anesthesia Physical ?Anesthesia Plan ? ?ASA: 2 ? ?Anesthesia Plan: General  ? ?Post-op Pain Management: Regional block*, Precedex, Tylenol PO (pre-op)*, Ketamine IV* and Dilaudid IV  ? ?Induction: Intravenous ? ?PONV Risk Score and Plan: 2 and Treatment may vary due to age or medical condition, Propofol infusion and Ondansetron ? ?Airway Management Planned: LMA ? ?Additional Equipment:  ? ?Intra-op Plan:  ? ?Post-operative Plan: Extubation in OR ? ?Informed Consent: I have reviewed the patients History and Physical, chart, labs and discussed the procedure including the risks, benefits and alternatives for the proposed anesthesia with the patient or authorized representative who has indicated his/her understanding and  acceptance.  ? ? ? ?Dental advisory given ? ?Plan Discussed with: Anesthesiologist and CRNA ? ?Anesthesia Plan Comments: (See PAT note 11/15/2021, Konrad Felix Ward, PA-C)  ? ? ? ?Anesthesia Quick Evaluation ? ?

## 2021-11-20 NOTE — Progress Notes (Signed)
Anesthesia Chart Review ? ? Case: [id number] Date/Time: 11/28/21 1535  ? Procedure: TOTAL KNEE REVISION (Right: Knee)  ? Anesthesia type: Choice  ? Pre-op diagnosis: Failed right total knee arthroplasty  ? Location: [facility] ROOM 09 / [facility]  ? Surgeons: Ollen Gross, MD  ? ?  ? ? ?DISCUSSION:60 y.o. smoker with h/o GERD, failed right total knee arthroplasty scheduled for above procedure 11/28/2021 with Dr. Ollen Gross.  ? ?H/o thoracic and lumbar fusion with hardware in place. T6-L3 posterior spinal instrumentation with decompression at L2-3, L3-4. Fusion with instrumentation L3-S1.  ?  ?Anticipate pt can proceed with planned procedure barring acute status change.   ?VS: BP (!) 152/82   Pulse 74   Temp 36.9 ?C (Oral)   Resp 18   Ht 5\' 9"  (1.753 m)   Wt 88.9 kg   SpO2 97%   BMI 28.94 kg/m?  ? ?PROVIDERS: ? , MD is PCP  ? ? ?LABS: Labs reviewed: Acceptable for surgery. ?(all labs ordered are listed, but only abnormal results are displayed) ? ?Labs Reviewed  ?SURGICAL PCR SCREEN - Abnormal; Notable for the following components:  ?    Result Value  ? Staphylococcus aureus POSITIVE (*)   ? All other components within normal limits  ?CBC - Abnormal; Notable for the following components:  ? RBC 4.07 (*)   ? Hemoglobin 11.5 (*)   ? HCT 36.8 (*)   ? RDW 23.5 (*)   ? All other components within normal limits  ?COMPREHENSIVE METABOLIC PANEL - Abnormal; Notable for the following components:  ? BUN 44 (*)   ? Total Protein 8.2 (*)   ? Alkaline Phosphatase 133 (*)   ? All other components within normal limits  ? ? ? ?IMAGES: ? ? ?EKG: ? ? ?CV: ? ?Past Medical History:  ?Diagnosis Date  ? Anemia   ? Arthritis   ? Attention deficit disorder   ? Diaphoresis   ? Dyspnea   ? GERD (gastroesophageal reflux disease)   ? Heart murmur   ? High blood pressure   ? not on meds in at least 4 months per pt stated MD took him off of meds  ? High cholesterol   ? History of kidney stones   ? ? ?Past Surgical History:   ?Procedure Laterality Date  ? BACK SURGERY    ? total of 5 back surgeries  ? BILATERAL CARPAL TUNNEL RELEASE    ? HERNIA REPAIR    ? KIDNEY STONE SURGERY    ? KIDNEY STONE SURGERY    ? x 2  ? KNEE SURGERY    ? LUMBAR FUSION    ? MEDIAL PARTIAL KNEE REPLACEMENT    ? REPLACEMENT TOTAL KNEE    ? SPINE SURGERY    ? ? ?MEDICATIONS: ? amoxicillin-clavulanate (AUGMENTIN) 875-125 MG tablet  ? baclofen (LIORESAL) 10 MG tablet  ? buPROPion (WELLBUTRIN XL) 300 MG 24 hr tablet  ? diclofenac (VOLTAREN) 75 MG EC tablet  ? DULoxetine (CYMBALTA) 60 MG capsule  ? esomeprazole (NEXIUM) 20 MG capsule  ? ferrous sulfate 325 (65 FE) MG tablet  ? montelukast (SINGULAIR) 10 MG tablet  ? pramipexole (MIRAPEX) 0.5 MG tablet  ? pregabalin (LYRICA) 100 MG capsule  ? promethazine-dextromethorphan (PROMETHAZINE-DM) 6.25-15 MG/5ML syrup  ? tadalafil (CIALIS) 20 MG tablet  ? Testosterone Cypionate 200 MG/ML SOLN  ? valACYclovir (VALTREX) 500 MG tablet  ? ?No current facility-administered medications for this encounter.  ? ? ? ?11-04-1990 Ward, PA-C ?[facility]  Pre-Surgical Testing ?[phone] ? ? ? ?

## 2021-11-23 DIAGNOSIS — R7989 Other specified abnormal findings of blood chemistry: Secondary | ICD-10-CM | POA: Diagnosis not present

## 2021-11-23 DIAGNOSIS — D509 Iron deficiency anemia, unspecified: Secondary | ICD-10-CM | POA: Diagnosis not present

## 2021-11-28 ENCOUNTER — Inpatient Hospital Stay (HOSPITAL_COMMUNITY): Payer: Medicare Other | Admitting: Certified Registered Nurse Anesthetist

## 2021-11-28 ENCOUNTER — Encounter (HOSPITAL_COMMUNITY): Payer: Self-pay | Admitting: Orthopedic Surgery

## 2021-11-28 ENCOUNTER — Inpatient Hospital Stay (HOSPITAL_COMMUNITY)
Admission: RE | Admit: 2021-11-28 | Discharge: 2021-11-30 | DRG: 468 | Disposition: A | Discharge status: Home Health | Payer: Medicare Other | Attending: Orthopedic Surgery | Admitting: Orthopedic Surgery

## 2021-11-28 ENCOUNTER — Other Ambulatory Visit: Payer: Self-pay

## 2021-11-28 ENCOUNTER — Inpatient Hospital Stay (HOSPITAL_COMMUNITY): Payer: Medicare Other | Admitting: Physician Assistant

## 2021-11-28 ENCOUNTER — Encounter (HOSPITAL_COMMUNITY)
Admission: RE | Disposition: A | Discharge status: Home Health | Payer: Self-pay | Source: Home / Self Care | Attending: Orthopedic Surgery

## 2021-11-28 DIAGNOSIS — Z87442 Personal history of urinary calculi: Secondary | ICD-10-CM

## 2021-11-28 DIAGNOSIS — F17210 Nicotine dependence, cigarettes, uncomplicated: Secondary | ICD-10-CM | POA: Diagnosis not present

## 2021-11-28 DIAGNOSIS — F32A Depression, unspecified: Secondary | ICD-10-CM | POA: Diagnosis present

## 2021-11-28 DIAGNOSIS — E291 Testicular hypofunction: Secondary | ICD-10-CM | POA: Diagnosis present

## 2021-11-28 DIAGNOSIS — K219 Gastro-esophageal reflux disease without esophagitis: Secondary | ICD-10-CM | POA: Diagnosis present

## 2021-11-28 DIAGNOSIS — Z79890 Hormone replacement therapy (postmenopausal): Secondary | ICD-10-CM

## 2021-11-28 DIAGNOSIS — F988 Other specified behavioral and emotional disorders with onset usually occurring in childhood and adolescence: Secondary | ICD-10-CM | POA: Diagnosis present

## 2021-11-28 DIAGNOSIS — M21372 Foot drop, left foot: Secondary | ICD-10-CM | POA: Diagnosis present

## 2021-11-28 DIAGNOSIS — T84092A Other mechanical complication of internal right knee prosthesis, initial encounter: Principal | ICD-10-CM | POA: Diagnosis present

## 2021-11-28 DIAGNOSIS — Y792 Prosthetic and other implants, materials and accessory orthopedic devices associated with adverse incidents: Secondary | ICD-10-CM | POA: Diagnosis present

## 2021-11-28 DIAGNOSIS — M1711 Unilateral primary osteoarthritis, right knee: Secondary | ICD-10-CM | POA: Diagnosis present

## 2021-11-28 DIAGNOSIS — T84012A Broken internal right knee prosthesis, initial encounter: Secondary | ICD-10-CM

## 2021-11-28 DIAGNOSIS — Z96651 Presence of right artificial knee joint: Secondary | ICD-10-CM | POA: Diagnosis not present

## 2021-11-28 DIAGNOSIS — Z79899 Other long term (current) drug therapy: Secondary | ICD-10-CM | POA: Diagnosis not present

## 2021-11-28 DIAGNOSIS — D649 Anemia, unspecified: Secondary | ICD-10-CM | POA: Diagnosis not present

## 2021-11-28 DIAGNOSIS — T84032A Mechanical loosening of internal right knee prosthetic joint, initial encounter: Secondary | ICD-10-CM | POA: Diagnosis present

## 2021-11-28 DIAGNOSIS — G8918 Other acute postprocedural pain: Secondary | ICD-10-CM | POA: Diagnosis not present

## 2021-11-28 DIAGNOSIS — Z96659 Presence of unspecified artificial knee joint: Secondary | ICD-10-CM

## 2021-11-28 DIAGNOSIS — E7800 Pure hypercholesterolemia, unspecified: Secondary | ICD-10-CM | POA: Diagnosis present

## 2021-11-28 HISTORY — PX: TOTAL KNEE REVISION: SHX996

## 2021-11-28 SURGERY — TOTAL KNEE REVISION
Anesthesia: General | Site: Knee | Laterality: Right

## 2021-11-28 MED ORDER — MIDAZOLAM HCL 2 MG/2ML IJ SOLN
INTRAMUSCULAR | Status: AC
  Filled 2021-11-28: qty 2

## 2021-11-28 MED ORDER — SODIUM CHLORIDE (PF) 0.9 % IJ SOLN
INTRAMUSCULAR | Status: AC
  Filled 2021-11-28: qty 50

## 2021-11-28 MED ORDER — BISACODYL 10 MG RE SUPP: 10.0000 mg | Freq: Every day | RECTAL | Status: DC | PRN

## 2021-11-28 MED ORDER — METHOCARBAMOL 500 MG PO TABS
500.0000 mg | ORAL_TABLET | Freq: Four times a day (QID) | ORAL | Status: DC | PRN
  Administered 2021-11-28 – 2021-11-30 (×5): 500 mg via ORAL
  Filled 2021-11-28 (×5): qty 1

## 2021-11-28 MED ORDER — CEFAZOLIN SODIUM-DEXTROSE 2-4 GM/100ML-% IV SOLN
2.0000 g | INTRAVENOUS | Status: AC
  Administered 2021-11-28: 2 g via INTRAVENOUS
  Filled 2021-11-28: qty 100

## 2021-11-28 MED ORDER — SODIUM CHLORIDE 0.9 % IR SOLN
Status: DC | PRN
  Administered 2021-11-28: 1000 mL

## 2021-11-28 MED ORDER — ONDANSETRON HCL 4 MG/2ML IJ SOLN: 4.0000 mg | Freq: Once | INTRAMUSCULAR | Status: DC | PRN

## 2021-11-28 MED ORDER — ONDANSETRON HCL 4 MG/2ML IJ SOLN
INTRAMUSCULAR | Status: DC | PRN
Start: 2021-11-28 — End: 2021-11-28
  Administered 2021-11-28: 4 mg via INTRAVENOUS

## 2021-11-28 MED ORDER — OXYCODONE HCL 5 MG PO TABS: 5.0000 mg | ORAL_TABLET | ORAL | Status: DC | PRN

## 2021-11-28 MED ORDER — BUPIVACAINE LIPOSOME 1.3 % IJ SUSP
INTRAMUSCULAR | Status: AC
  Filled 2021-11-28: qty 20

## 2021-11-28 MED ORDER — HYDROMORPHONE HCL 1 MG/ML IJ SOLN
INTRAMUSCULAR | Status: AC
  Filled 2021-11-28: qty 1

## 2021-11-28 MED ORDER — ACETAMINOPHEN 10 MG/ML IV SOLN
1000.0000 mg | Freq: Four times a day (QID) | INTRAVENOUS | Status: DC
  Administered 2021-11-28: 1000 mg via INTRAVENOUS
  Filled 2021-11-28: qty 100

## 2021-11-28 MED ORDER — METHOCARBAMOL 1000 MG/10ML IJ SOLN
500.0000 mg | Freq: Four times a day (QID) | INTRAVENOUS | Status: DC | PRN
  Filled 2021-11-28: qty 5

## 2021-11-28 MED ORDER — SODIUM CHLORIDE 0.9 % IV SOLN: INTRAVENOUS | Status: DC

## 2021-11-28 MED ORDER — DEXAMETHASONE SODIUM PHOSPHATE 10 MG/ML IJ SOLN
10.0000 mg | Freq: Once | INTRAMUSCULAR | Status: AC
  Administered 2021-11-29: 10 mg via INTRAVENOUS
  Filled 2021-11-28: qty 1

## 2021-11-28 MED ORDER — ONDANSETRON HCL 4 MG PO TABS: 4.0000 mg | ORAL_TABLET | Freq: Four times a day (QID) | ORAL | Status: DC | PRN

## 2021-11-28 MED ORDER — METOCLOPRAMIDE HCL 5 MG PO TABS: 5.0000 mg | ORAL_TABLET | Freq: Three times a day (TID) | ORAL | Status: DC | PRN

## 2021-11-28 MED ORDER — BUPIVACAINE LIPOSOME 1.3 % IJ SUSP: 20.0000 mL | Freq: Once | INTRAMUSCULAR | Status: AC

## 2021-11-28 MED ORDER — MORPHINE SULFATE (PF) 2 MG/ML IV SOLN
1.0000 mg | INTRAVENOUS | Status: DC | PRN
  Administered 2021-11-28 – 2021-11-29 (×4): 2 mg via INTRAVENOUS
  Filled 2021-11-28 (×4): qty 1

## 2021-11-28 MED ORDER — OXYCODONE HCL 5 MG PO TABS
10.0000 mg | ORAL_TABLET | ORAL | Status: DC | PRN
  Administered 2021-11-28 – 2021-11-30 (×8): 15 mg via ORAL
  Filled 2021-11-28 (×8): qty 3

## 2021-11-28 MED ORDER — CEFAZOLIN SODIUM-DEXTROSE 2-4 GM/100ML-% IV SOLN
2.0000 g | Freq: Four times a day (QID) | INTRAVENOUS | Status: AC
  Administered 2021-11-28 – 2021-11-29 (×2): 2 g via INTRAVENOUS
  Filled 2021-11-28 (×2): qty 100

## 2021-11-28 MED ORDER — SODIUM CHLORIDE 0.9 % IV SOLN
INTRAVENOUS | Status: DC | PRN
  Administered 2021-11-28: 80 mL

## 2021-11-28 MED ORDER — OXYCODONE HCL 5 MG PO TABS: 5.0000 mg | ORAL_TABLET | Freq: Once | ORAL | Status: DC | PRN

## 2021-11-28 MED ORDER — BUPROPION HCL ER (XL) 300 MG PO TB24
300.0000 mg | ORAL_TABLET | Freq: Every day | ORAL | Status: DC
Start: 2021-11-28 — End: 2021-11-30
  Administered 2021-11-28 – 2021-11-29 (×2): 300 mg via ORAL
  Filled 2021-11-28 (×2): qty 1

## 2021-11-28 MED ORDER — DEXMEDETOMIDINE (PRECEDEX) IN NS 20 MCG/5ML (4 MCG/ML) IV SYRINGE
PREFILLED_SYRINGE | INTRAVENOUS | Status: DC | PRN
  Administered 2021-11-28 (×2): 8 ug via INTRAVENOUS

## 2021-11-28 MED ORDER — ACETAMINOPHEN 500 MG PO TABS
1000.0000 mg | ORAL_TABLET | Freq: Four times a day (QID) | ORAL | Status: AC
  Administered 2021-11-29 (×2): 1000 mg via ORAL
  Filled 2021-11-28 (×3): qty 2

## 2021-11-28 MED ORDER — LACTATED RINGERS IV SOLN: INTRAVENOUS | Status: DC

## 2021-11-28 MED ORDER — FENTANYL CITRATE PF 50 MCG/ML IJ SOSY
25.0000 ug | PREFILLED_SYRINGE | Freq: Once | INTRAMUSCULAR | Status: AC
  Administered 2021-11-28: 100 ug via INTRAVENOUS
  Filled 2021-11-28: qty 2

## 2021-11-28 MED ORDER — MIDAZOLAM HCL 2 MG/2ML IJ SOLN
0.5000 mg | Freq: Once | INTRAMUSCULAR | Status: AC
  Administered 2021-11-28: 2 mg via INTRAVENOUS
  Filled 2021-11-28: qty 2

## 2021-11-28 MED ORDER — PREGABALIN 100 MG PO CAPS
100.0000 mg | ORAL_CAPSULE | Freq: Three times a day (TID) | ORAL | Status: DC
  Administered 2021-11-28 – 2021-11-30 (×5): 100 mg via ORAL
  Filled 2021-11-28 (×5): qty 1

## 2021-11-28 MED ORDER — PANTOPRAZOLE SODIUM 40 MG PO TBEC
40.0000 mg | DELAYED_RELEASE_TABLET | Freq: Every day | ORAL | Status: DC
  Administered 2021-11-28 – 2021-11-30 (×3): 40 mg via ORAL
  Filled 2021-11-28 (×3): qty 1

## 2021-11-28 MED ORDER — 0.9 % SODIUM CHLORIDE (POUR BTL) OPTIME
TOPICAL | Status: DC | PRN
  Administered 2021-11-28: 1000 mL

## 2021-11-28 MED ORDER — FLEET ENEMA 7-19 GM/118ML RE ENEM: 1.0000 | ENEMA | Freq: Once | RECTAL | Status: DC | PRN

## 2021-11-28 MED ORDER — LIDOCAINE HCL (CARDIAC) PF 100 MG/5ML IV SOSY
PREFILLED_SYRINGE | INTRAVENOUS | Status: DC | PRN
  Administered 2021-11-28: 100 mg via INTRAVENOUS

## 2021-11-28 MED ORDER — PRAMIPEXOLE DIHYDROCHLORIDE 0.25 MG PO TABS
0.5000 mg | ORAL_TABLET | Freq: Every day | ORAL | Status: DC
  Administered 2021-11-28 – 2021-11-29 (×2): 0.5 mg via ORAL
  Filled 2021-11-28 (×2): qty 2

## 2021-11-28 MED ORDER — ASPIRIN EC 325 MG PO TBEC
325.0000 mg | DELAYED_RELEASE_TABLET | Freq: Two times a day (BID) | ORAL | Status: DC
  Administered 2021-11-29 – 2021-11-30 (×3): 325 mg via ORAL
  Filled 2021-11-28 (×3): qty 1

## 2021-11-28 MED ORDER — PROPOFOL 10 MG/ML IV BOLUS
INTRAVENOUS | Status: AC
Start: 2021-11-28 — End: ?
  Filled 2021-11-28: qty 20

## 2021-11-28 MED ORDER — HYDROMORPHONE HCL 2 MG/ML IJ SOLN
INTRAMUSCULAR | Status: AC
  Filled 2021-11-28: qty 1

## 2021-11-28 MED ORDER — PROPOFOL 10 MG/ML IV BOLUS
INTRAVENOUS | Status: DC | PRN
  Administered 2021-11-28: 200 mg via INTRAVENOUS

## 2021-11-28 MED ORDER — MIDAZOLAM HCL 5 MG/5ML IJ SOLN
INTRAMUSCULAR | Status: DC | PRN
  Administered 2021-11-28: 2 mg via INTRAVENOUS

## 2021-11-28 MED ORDER — ONDANSETRON HCL 4 MG/2ML IJ SOLN: 4.0000 mg | Freq: Four times a day (QID) | INTRAMUSCULAR | Status: DC | PRN

## 2021-11-28 MED ORDER — HYDROMORPHONE HCL 1 MG/ML IJ SOLN
INTRAMUSCULAR | Status: DC | PRN
  Administered 2021-11-28: .5 mg via INTRAVENOUS

## 2021-11-28 MED ORDER — PHENOL 1.4 % MT LIQD: 1.0000 | OROMUCOSAL | Status: DC | PRN

## 2021-11-28 MED ORDER — OXYCODONE HCL 5 MG/5ML PO SOLN: 5.0000 mg | Freq: Once | ORAL | Status: DC | PRN

## 2021-11-28 MED ORDER — MENTHOL 3 MG MT LOZG: 1.0000 | LOZENGE | OROMUCOSAL | Status: DC | PRN

## 2021-11-28 MED ORDER — FENTANYL CITRATE (PF) 100 MCG/2ML IJ SOLN
INTRAMUSCULAR | Status: DC | PRN
  Administered 2021-11-28 (×4): 50 ug via INTRAVENOUS

## 2021-11-28 MED ORDER — HYDROMORPHONE HCL 1 MG/ML IJ SOLN
0.2500 mg | INTRAMUSCULAR | Status: DC | PRN
  Administered 2021-11-28: 0.5 mg via INTRAVENOUS

## 2021-11-28 MED ORDER — DEXAMETHASONE SODIUM PHOSPHATE 10 MG/ML IJ SOLN: 8.0000 mg | Freq: Once | INTRAMUSCULAR | Status: AC

## 2021-11-28 MED ORDER — FERROUS SULFATE 325 (65 FE) MG PO TABS
325.0000 mg | ORAL_TABLET | Freq: Every day | ORAL | Status: DC
  Administered 2021-11-28 – 2021-11-30 (×3): 325 mg via ORAL
  Filled 2021-11-28 (×3): qty 1

## 2021-11-28 MED ORDER — DIPHENHYDRAMINE HCL 12.5 MG/5ML PO ELIX: 12.5000 mg | ORAL_SOLUTION | ORAL | Status: DC | PRN

## 2021-11-28 MED ORDER — POVIDONE-IODINE 10 % EX SWAB: 2.0000 "application " | Freq: Once | CUTANEOUS | Status: AC

## 2021-11-28 MED ORDER — SODIUM CHLORIDE (PF) 0.9 % IJ SOLN
INTRAMUSCULAR | Status: AC
  Filled 2021-11-28: qty 10

## 2021-11-28 MED ORDER — DEXAMETHASONE SODIUM PHOSPHATE 4 MG/ML IJ SOLN
INTRAMUSCULAR | Status: DC | PRN
  Administered 2021-11-28: 5 mg via INTRAVENOUS

## 2021-11-28 MED ORDER — DEXMEDETOMIDINE (PRECEDEX) IN NS 20 MCG/5ML (4 MCG/ML) IV SYRINGE
PREFILLED_SYRINGE | INTRAVENOUS | Status: AC
  Filled 2021-11-28: qty 5

## 2021-11-28 MED ORDER — DOCUSATE SODIUM 100 MG PO CAPS
100.0000 mg | ORAL_CAPSULE | Freq: Two times a day (BID) | ORAL | Status: DC
  Administered 2021-11-28 – 2021-11-30 (×4): 100 mg via ORAL
  Filled 2021-11-28 (×4): qty 1

## 2021-11-28 MED ORDER — POLYETHYLENE GLYCOL 3350 17 G PO PACK: 17.0000 g | PACK | Freq: Every day | ORAL | Status: DC | PRN

## 2021-11-28 MED ORDER — PHENYLEPHRINE HCL-NACL 20-0.9 MG/250ML-% IV SOLN
INTRAVENOUS | Status: DC | PRN
  Administered 2021-11-28: 45 ug/min via INTRAVENOUS

## 2021-11-28 MED ORDER — DULOXETINE HCL 60 MG PO CPEP
120.0000 mg | ORAL_CAPSULE | Freq: Every day | ORAL | Status: DC
  Administered 2021-11-28 – 2021-11-29 (×2): 120 mg via ORAL
  Filled 2021-11-28 (×3): qty 2

## 2021-11-28 MED ORDER — FENTANYL CITRATE (PF) 250 MCG/5ML IJ SOLN
INTRAMUSCULAR | Status: AC
  Filled 2021-11-28: qty 5

## 2021-11-28 MED ORDER — PROPOFOL 10 MG/ML IV BOLUS
INTRAVENOUS | Status: AC
  Filled 2021-11-28: qty 20

## 2021-11-28 MED ORDER — ROPIVACAINE HCL 5 MG/ML IJ SOLN
INTRAMUSCULAR | Status: DC | PRN
  Administered 2021-11-28: 30 mL via PERINEURAL

## 2021-11-28 MED ORDER — METOCLOPRAMIDE HCL 5 MG/ML IJ SOLN: 5.0000 mg | Freq: Three times a day (TID) | INTRAMUSCULAR | Status: DC | PRN

## 2021-11-28 MED ORDER — STERILE WATER FOR IRRIGATION IR SOLN
Status: DC | PRN
  Administered 2021-11-28: 2000 mL

## 2021-11-28 MED ORDER — TRANEXAMIC ACID-NACL 1000-0.7 MG/100ML-% IV SOLN
1000.0000 mg | INTRAVENOUS | Status: AC
  Administered 2021-11-28: 1000 mg via INTRAVENOUS
  Filled 2021-11-28: qty 100

## 2021-11-28 SURGICAL SUPPLY — 76 items
ADAPTER BOLT FEMORAL +2/-2 (Knees) ×1 IMPLANT
AUG FEM DIST PFC 4 8 RT (Knees) ×2 IMPLANT
AUGMENT FEM DIST PFC 4 8 RT (Knees) IMPLANT
AUGMENT FMRL PST PFC SIG SZ4 8 (Orthopedic Implant) IMPLANT
BAG COUNTER SPONGE SURGICOUNT (BAG) IMPLANT
BAG DECANTER FOR FLEXI CONT (MISCELLANEOUS) ×2 IMPLANT
BAG ZIPLOCK 12X15 (MISCELLANEOUS) IMPLANT
BLADE SAG 18X100X1.27 (BLADE) ×2 IMPLANT
BLADE SAW SGTL 11.0X1.19X90.0M (BLADE) ×2 IMPLANT
BLADE SURG SZ10 CARB STEEL (BLADE) IMPLANT
BNDG ELASTIC 6X5.8 VLCR STR LF (GAUZE/BANDAGES/DRESSINGS) ×2 IMPLANT
BONE CEMENT GENTAMICIN (Cement) ×6 IMPLANT
CEMENT BONE GENTAMICIN 40 (Cement) ×3 IMPLANT
CEMENT RESTRICTOR DEPUY SZ 4 (Cement) ×1 IMPLANT
COMP FEM CEM RT SZ4 (Orthopedic Implant) ×2 IMPLANT
COMPONENT FEM CEM RT SZ4 (Orthopedic Implant) IMPLANT
COVER SURGICAL LIGHT HANDLE (MISCELLANEOUS) ×2 IMPLANT
CUFF TOURN SGL QUICK 34 (TOURNIQUET CUFF) ×2
CUFF TRNQT CYL 34X4.125X (TOURNIQUET CUFF) ×1 IMPLANT
DISTAL AUGMENT (Knees) ×4 IMPLANT
DRAPE INCISE IOBAN 66X45 STRL (DRAPES) ×2 IMPLANT
DRAPE U-SHAPE 47X51 STRL (DRAPES) ×2 IMPLANT
DRESSING AQUACEL AG SP 3.5X10 (GAUZE/BANDAGES/DRESSINGS) IMPLANT
DRSG ADAPTIC 3X8 NADH LF (GAUZE/BANDAGES/DRESSINGS) ×2 IMPLANT
DRSG AQUACEL AG ADV 3.5X14 (GAUZE/BANDAGES/DRESSINGS) ×1 IMPLANT
DRSG AQUACEL AG SP 3.5X10 (GAUZE/BANDAGES/DRESSINGS) ×2
DRSG PAD ABDOMINAL 8X10 ST (GAUZE/BANDAGES/DRESSINGS) ×2 IMPLANT
DURAPREP 26ML APPLICATOR (WOUND CARE) ×2 IMPLANT
ELECT REM PT RETURN 15FT ADLT (MISCELLANEOUS) ×2 IMPLANT
EVACUATOR 1/8 PVC DRAIN (DRAIN) IMPLANT
FEM POST AUG PFC SIGMA SZ4 8MM (Orthopedic Implant) ×4 IMPLANT
FEMORAL ADAPTER (Orthopedic Implant) ×1 IMPLANT
GAUZE SPONGE 4X4 12PLY STRL (GAUZE/BANDAGES/DRESSINGS) ×2 IMPLANT
GLOVE SRG 8 PF TXTR STRL LF DI (GLOVE) ×1 IMPLANT
GLOVE SURG ENC MOIS LTX SZ6.5 (GLOVE) ×4 IMPLANT
GLOVE SURG ENC MOIS LTX SZ8 (GLOVE) ×4 IMPLANT
GLOVE SURG UNDER POLY LF SZ7 (GLOVE) ×2 IMPLANT
GLOVE SURG UNDER POLY LF SZ8 (GLOVE) ×2
GOWN SPEC L4 XLG W/TWL (GOWN DISPOSABLE) ×4 IMPLANT
GOWN STRL REUS W/ TWL LRG LVL3 (GOWN DISPOSABLE) ×2 IMPLANT
GOWN STRL REUS W/TWL LRG LVL3 (GOWN DISPOSABLE) ×4
HANDPIECE INTERPULSE COAX TIP (DISPOSABLE) ×2
HOLDER FOLEY CATH W/STRAP (MISCELLANEOUS) IMPLANT
IMMOBILIZER KNEE 20 (SOFTGOODS) ×2
IMMOBILIZER KNEE 20 THIGH 36 (SOFTGOODS) ×1 IMPLANT
IMMOBILIZER KNEE 22 UNIV (SOFTGOODS) ×1 IMPLANT
INSERT TIBIAL TC3 RP SZ4.0 25 (Knees) ×1 IMPLANT
KIT TURNOVER KIT A (KITS) IMPLANT
MANIFOLD NEPTUNE II (INSTRUMENTS) ×2 IMPLANT
NS IRRIG 1000ML POUR BTL (IV SOLUTION) ×2 IMPLANT
PACK TOTAL KNEE CUSTOM (KITS) ×2 IMPLANT
PADDING CAST COTTON 6X4 STRL (CAST SUPPLIES) ×3 IMPLANT
PROTECTOR NERVE ULNAR (MISCELLANEOUS) ×2 IMPLANT
SET HNDPC FAN SPRY TIP SCT (DISPOSABLE) ×1 IMPLANT
SPIKE FLUID TRANSFER (MISCELLANEOUS) IMPLANT
STAPLER VISISTAT 35W (STAPLE) ×1 IMPLANT
STEM TIBIA PFC 13X30MM (Stem) ×1 IMPLANT
STEM UNIVERSAL REVISION 75X18 (Stem) ×1 IMPLANT
STRIP CLOSURE SKIN 1/2X4 (GAUZE/BANDAGES/DRESSINGS) ×3 IMPLANT
SUT MNCRL AB 4-0 PS2 18 (SUTURE) ×2 IMPLANT
SUT STRATAFIX 0 PDS 27 VIOLET (SUTURE) ×2
SUT VIC AB 2-0 CT1 27 (SUTURE) ×6
SUT VIC AB 2-0 CT1 TAPERPNT 27 (SUTURE) ×3 IMPLANT
SUTURE STRATFX 0 PDS 27 VIOLET (SUTURE) ×1 IMPLANT
SWAB COLLECTION DEVICE MRSA (MISCELLANEOUS) IMPLANT
SWAB CULTURE ESWAB REG 1ML (MISCELLANEOUS) IMPLANT
SYR 50ML LL SCALE MARK (SYRINGE) ×4 IMPLANT
TOWER CARTRIDGE SMART MIX (DISPOSABLE) ×2 IMPLANT
TRAY FOLEY MTR SLVR 16FR STAT (SET/KITS/TRAYS/PACK) ×2 IMPLANT
TRAY REVISION SZ 4 (Knees) ×1 IMPLANT
TRAY SLEEVE CEM ML (Knees) ×1 IMPLANT
TUBE KAMVAC SUCTION (TUBING) IMPLANT
TUBE SUCTION HIGH CAP CLEAR NV (SUCTIONS) ×2 IMPLANT
WATER STERILE IRR 1000ML POUR (IV SOLUTION) ×2 IMPLANT
WEDGE SZ 4 15MM (Knees) ×1 IMPLANT
WRAP KNEE MAXI GEL POST OP (GAUZE/BANDAGES/DRESSINGS) ×1 IMPLANT

## 2021-11-28 NOTE — Interval H&P Note (Signed)
History and Physical Interval Note: ? ?11/28/2021 ?1:30 PM ? ?Charles Lara  has presented today for surgery, with the diagnosis of Failed right total knee arthroplasty.  The various methods of treatment have been discussed with the patient and family. After consideration of risks, benefits and other options for treatment, the patient has consented to  Procedure(s): ?TOTAL KNEE REVISION (Right) as a surgical intervention.  The patient's history has been reviewed, patient examined, no change in status, stable for surgery.  I have reviewed the patient's chart and labs.  Questions were answered to the patient's satisfaction.   ? ? ?[name] ? ? ?

## 2021-11-28 NOTE — H&P (Signed)
TOTAL KNEE  REVISION ADMISSION H&P ? ?Patient is being admitted for right total knee arthroplasty revision. ? ?Subjective: ? ?Chief Complaint: Right knee pain. ? ?HPI: Charles Lara is a pleasant [id number] year old male who comes to the clinic for follow-up of right knee osteoarthritis. He is status post right total knee arthroplasty. The patient indicates his right knee began to bother him over 1 year ago. He denies any known injury. He notes his pain has been progressively worsening. ? ?The patient indicates his pain as a sudden sharp pain. He notes his pain is aggravated by weight-bearing. He states he has been taking diclofenac, which provides significant relief of his swelling. ? ?The patient indicates his left knee is tilted on his tibia. He notes he has been putting more pressure on his right knee due to his back surgery. ? ?The patient indicates he has a foot drop on his left side. He notes he has been wearing a brace on his left foot. He states he is unsure if wearing the brace has caused his left foot to get weaker. He notes his left foot is numb, but he is unsure if it is from his back. ? ?Patient Active Problem List  ? Diagnosis Date Noted  ? Generalized hyperhidrosis-seen by Endo in past 07/14/2018  ? ED (erectile dysfunction) 07/14/2018  ? Testosterone deficiency in male 07/14/2018  ? Attention deficit disorder 07/14/2018  ? h/o High blood pressure 07/14/2018  ? Kidney stones-has urologist 07/14/2018  ? Depression 07/14/2018  ? Tobacco abuse counseling 07/14/2018  ? Tobacco use disorder-  proximally 45-pack-year history, current smoker 07/14/2018  ? Chronic pain 11/22/2013  ? GERD (gastroesophageal reflux disease) 11/22/2013  ? Hyperlipidemia 11/22/2013  ? Chronic pain disorder 02/21/2012  ? Postlaminectomy syndrome, lumbar region 02/21/2012  ? Other chronic postoperative pain 02/21/2012  ? ? ?Past Medical History:  ?Diagnosis Date  ? Anemia   ? Arthritis   ? Attention deficit disorder   ? Diaphoresis   ?  Dyspnea   ? GERD (gastroesophageal reflux disease)   ? Heart murmur   ? High blood pressure   ? not on meds in at least 4 months per pt stated MD took him off of meds  ? High cholesterol   ? History of kidney stones   ? ? ?Past Surgical History:  ?Procedure Laterality Date  ? BACK SURGERY    ? total of 5 back surgeries  ? BILATERAL CARPAL TUNNEL RELEASE    ? HERNIA REPAIR    ? KIDNEY STONE SURGERY    ? KIDNEY STONE SURGERY    ? x 2  ? KNEE SURGERY    ? LUMBAR FUSION    ? MEDIAL PARTIAL KNEE REPLACEMENT    ? REPLACEMENT TOTAL KNEE    ? SPINE SURGERY    ? ? ?Prior to Admission medications   ?Medication Sig Start Date End Date Taking? Authorizing Provider  ?baclofen (LIORESAL) 10 MG tablet Take 10 mg by mouth 2 (two) times daily. 07/13/18  Yes [provider]  ?buPROPion (WELLBUTRIN XL) 300 MG 24 hr tablet Take 300 mg by mouth daily.   Yes [provider]  ?diclofenac (VOLTAREN) 75 MG EC tablet Take 75 mg by mouth 2 (two) times daily.   Yes [provider]  ?DULoxetine (CYMBALTA) 60 MG capsule Take 120 mg by mouth in the morning. 07/13/18  Yes [provider]  ?esomeprazole (NEXIUM) 20 MG capsule Take 20 mg by mouth daily at 12 noon.   Yes [provider]  ?ferrous sulfate 325 (65 FE) MG tablet Take 325 mg by mouth daily.   Yes [provider]  ?pramipexole (MIRAPEX) 0.5 MG tablet Take 0.5 mg by mouth at bedtime. 07/13/18  Yes [provider]  ?pregabalin (LYRICA) 100 MG capsule Take 100 mg by mouth 3 (three) times daily.   Yes [provider]  ?tadalafil (CIALIS) 20 MG tablet Take 20 mg by mouth daily as needed for erectile dysfunction.   Yes [provider]  ?Testosterone Cypionate 200 MG/ML SOLN Inject 100 mg into the muscle once a week.   Yes [provider]  ?valACYclovir (VALTREX) 500 MG tablet Take 500 mg by mouth 2 (two) times daily as needed. 07/30/21  Yes [provider]  ?amoxicillin-clavulanate (AUGMENTIN)  875-125 MG tablet Take 1 tablet by mouth every 12 (twelve) hours. ?Patient not taking: Reported on 11/13/2021 10/29/18   Lurline Idol, FNP  ?montelukast (SINGULAIR) 10 MG tablet Take 1 tablet (10 mg total) by mouth at bedtime. ?Patient not taking: Reported on 11/13/2021 10/29/18   Lurline Idol, FNP  ?promethazine-dextromethorphan (PROMETHAZINE-DM) 6.25-15 MG/5ML syrup Take 5 mLs by mouth 4 (four) times daily as needed for cough. ?Patient not taking: Reported on 11/13/2021 10/29/18   Lurline Idol, FNP  ? ? ?No Known Allergies ? ?Social History  ? ?Socioeconomic History  ? Marital status: Divorced  ?  Spouse name: Not on file  ? Number of children: Not on file  ? Years of education: Not on file  ? Highest education level: Not on file  ?Occupational History  ? Not on file  ?Tobacco Use  ? Smoking status: Every Day  ?  Packs/day: 1.00  ?  Years: 45.00  ?  Pack years: 45.00  ?  Types: Cigarettes  ? Smokeless tobacco: Never  ?Vaping Use  ? Vaping Use: Never used  ?Substance and Sexual Activity  ? Alcohol use: Yes  ?  Alcohol/week: 12.0 standard drinks  ?  Types: 12 Standard drinks or equivalent per week  ?  Comment: drinks 1-2 x per week  ? Drug use: Not Currently  ?  Types: Cocaine  ?  Comment: none since 19990s  ? Sexual activity: Yes  ?  Partners: Female  ?  Birth control/protection: None  ?Other Topics Concern  ? Not on file  ?Social History Narrative  ? Not on file  ? ?Social Determinants of Health  ? ?Financial Resource Strain: Not on file  ?Food Insecurity: Not on file  ?Transportation Needs: Not on file  ?Physical Activity: Not on file  ?Stress: Not on file  ?Social Connections: Not on file  ?Intimate Partner Violence: Not on file  ? ? ?Tobacco Use: High Risk  ? Smoking Tobacco Use: Every Day  ? Smokeless Tobacco Use: Never  ? Passive Exposure: Not on file  ? ?Social History  ? ?Substance and Sexual Activity  ?Alcohol Use Yes  ? Alcohol/week: 12.0 standard drinks  ? Types: 12 Standard drinks or equivalent  per week  ? Comment: drinks 1-2 x per week  ? ? ?No family history on file. ? ?ROS ? ?Objective: ? ?Physical Exam: ?Well nourished and well developed.  ?General: Alert and oriented x3, cooperative and pleasant, no acute distress.  ?Head: normocephalic, atraumatic, neck supple.  ?Eyes: EOMI.  ?Respiratory: breath sounds clear in all fields, no wheezing, rales, or rhonchi. ?Cardiovascular: Regular rate and rhythm, no murmurs, gallops or rubs.  ?Abdomen: non-tender to palpation and soft, normoactive bowel sounds. ?Musculoskeletal: ? ?The  patient has a significantly antalgic gait pattern and ambulates independently.  ?  ?Right Knee Exam:  ?Significant varus deformity.  ?No effusion present. No swelling present.  ?The range of motion is: 5 to 100 degrees.  ?No crepitus on range of motion of the knee.  ?Medial joint line tenderness.  ?No lateral joint line tenderness.  ?Varus/valgus laxity and AP laxity.  ?  ?Left Knee Exam:  ?Valgus deformity.  ?No effusion present. No swelling present.  ?The Range of motion is: 5 to 100 degrees.  ?No crepitus on range of motion of the knee.  ?Positive lateral greater than medial joint line tenderness.  ?The knee is stable. ? ?Calves soft and nontender. Motor function intact in LE. Strength 5/5 LE bilaterally. ?Neuro: Distal pulses 2+. Sensation to light touch intact in LE. ? ? ? ?Vital signs in last 24 hours: ?  ? ?Imaging Review ?AP and lateral of the right knee dated 11/08/2021 demonstrate a loose tibial component. This is a revision component from his previous conversion of a unicompartmental to a total knee. His femur looks loose as well. He is collapsed into varus. His left knee shows unicompartmental replacement medially but with significant bone on bone disease lateral and patellofemoral. ? ?Assessment/Plan: ? ? ASSESSMENT ? 1. Right knee pain. ?  ?  ? 2. Left knee pain. ?  ?  ? PLAN ? 1. We discussed the patient's presenting complaints, history, and treatment options. This is  more acute of the problems with a tibial component that is loose. On the lateral view, he is almost coming through posteriorly with the tibial stem. He is going to need a total knee revision. He is set up for

## 2021-11-28 NOTE — Progress Notes (Signed)
Assisted Dr. Foster with right, adductor canal, ultrasound guided block. Side rails up, monitors on throughout procedure. See vital signs in flow sheet. Tolerated Procedure well.  

## 2021-11-28 NOTE — Progress Notes (Signed)
Orthopedic Tech Progress Note ?Patient Details:  ?Charles Lara ?1960/11/24 ?[id number] ? ?Patient ID: Charles Lara, male   DOB: Feb 27, 1961, 61 y.o.   MRN: [id number] ? ?[name] ?11/28/2021, 8:35 PM ?Cpm removed ?

## 2021-11-28 NOTE — Anesthesia Procedure Notes (Signed)
Anesthesia Regional Block: Adductor canal block  ? ?Pre-Anesthetic Checklist: , timeout performed,  Correct Patient, Correct Site, Correct Laterality,  Correct Procedure, Correct Position, site marked,  Risks and benefits discussed,  Surgical consent,  Pre-op evaluation,  At surgeon's request ? ?Laterality: Right ? ?Prep: chloraprep     ?  ?Needles:  ?Injection technique: Single-shot ? ?Needle Type: Echogenic Stimulator Needle   ? ? ?Needle Length: 10cm  ?Needle Gauge: 21  ? ?Needle insertion depth: 6 cm ? ? ?Additional Needles: ? ? ?Procedures:,,,, ultrasound used (permanent image in chart),,    ?Narrative:  ?Start time: 11/28/2021 1:25 PM ?End time: 11/28/2021 1:30 PM ?Injection made incrementally with aspirations every 5 mL. ? ?Performed by: Personally  ?Anesthesiologist: Mal Amabile, MD ? ?Additional Notes: ?Timeout performed. Patient sedated. Relevant anatomy ID'd using [place]. Incremental 2-23ml injection of LA with frequent aspiration. Patient tolerated procedure well. ? ? ? ?Right Adductor Canal Block ? ?

## 2021-11-28 NOTE — Anesthesia Postprocedure Evaluation (Signed)
Anesthesia Post Note ? ?Patient: Diante Barley ? ?Procedure(s) Performed: TOTAL KNEE REVISION (Right: Knee) ? ?  ? ?Patient location during evaluation: PACU ?Anesthesia Type: General ?Level of consciousness: awake and alert and oriented ?Pain management: pain level controlled ?Vital Signs Assessment: post-procedure vital signs reviewed and stable ?Respiratory status: spontaneous breathing, nonlabored ventilation and respiratory function stable ?Cardiovascular status: blood pressure returned to baseline and stable ?Postop Assessment: no apparent nausea or vomiting ?Anesthetic complications: no ? ? ?No notable events documented. ? ?Last Vitals:  ?Vitals:  ? 11/28/21 1715 11/28/21 1730  ?BP: 127/72 131/69  ?Pulse: 75 75  ?Resp: 15 10  ?Temp:    ?SpO2: 96% 98%  ?  ?Last Pain:  ?Vitals:  ? 11/28/21 1715  ?TempSrc:   ?PainSc: Asleep  ? ? ?  ?  ?  ?  ?  ?  ? ?Delyla Sandeen A. ? ? ? ? ?

## 2021-11-28 NOTE — Plan of Care (Signed)
?  Problem: Education: ?Goal: Knowledge of General Education information will improve ?Description: Including pain rating scale, medication(s)/side effects and non-pharmacologic comfort measures ?Outcome: Progressing ?  ?Problem: Activity: ?Goal: Risk for activity intolerance will decrease ?Outcome: Progressing ?  ?Problem: Elimination: ?Goal: Will not experience complications related to bowel motility ?Outcome: Progressing ?  ?Problem: Pain Managment: ?Goal: General experience of comfort will improve ?Outcome: Progressing ?  ?Problem: Education: ?Goal: Knowledge of the prescribed therapeutic regimen will improve ?Outcome: Progressing ?  ?Problem: Pain Management: ?Goal: Pain level will decrease with appropriate interventions ?Outcome: Progressing ?  ?Problem: Education: ?Goal: Knowledge of General Education information will improve ?Description: Including pain rating scale, medication(s)/side effects and non-pharmacologic comfort measures ?Outcome: Progressing ?  ?Problem: Activity: ?Goal: Risk for activity intolerance will decrease ?Outcome: Progressing ?  ?Problem: Nutrition: ?Goal: Adequate nutrition will be maintained ?Outcome: Progressing ?  ?Problem: Elimination: ?Goal: Will not experience complications related to bowel motility ?Outcome: Progressing ?  ?Problem: Pain Managment: ?Goal: General experience of comfort will improve ?Outcome: Progressing ?  ?

## 2021-11-28 NOTE — Anesthesia Procedure Notes (Signed)
Procedure Name: LMA Insertion ?Date/Time: 11/28/2021 2:00 PM ?Performed by: Lavina Hamman, CRNA ?Pre-anesthesia Checklist: Patient identified, Emergency Drugs available, Suction available and Patient being monitored ?Patient Re-evaluated:Patient Re-evaluated prior to induction ?Oxygen Delivery Method: Circle System Utilized ?Preoxygenation: Pre-oxygenation with 100% oxygen ?Induction Type: IV induction ?Ventilation: Mask ventilation without difficulty ?LMA: LMA inserted ?LMA Size: 4.0 ?Number of attempts: 1 ?Airway Equipment and Method: Bite block ?Placement Confirmation: positive ETCO2 ?Tube secured with: Tape ?Dental Injury: Teeth and Oropharynx as per pre-operative assessment  ? ? ? ? ?

## 2021-11-28 NOTE — Discharge Instructions (Signed)
? ?Gaynelle Arabian, MD ?Total Joint Specialist ?[facility] ?[address] ?[place], [place] [address] ?[phone] ? ?TOTAL KNEE REVISION POSTOPERATIVE DIRECTIONS ? ?Knee Rehabilitation, Guidelines Following Surgery  ?Results after knee surgery are often greatly improved when you follow the exercise, range of motion and muscle strengthening exercises prescribed by your doctor. Safety measures are also important to protect the knee from further injury. If any of these exercises cause you to have increased pain or swelling in your knee joint, decrease the amount until you are comfortable again and slowly increase them. If you have problems or questions, call your caregiver or physical therapist for advice.  ? ?BLOOD CLOT PREVENTION ?Take a 325 mg Aspirin two times a day for three weeks following surgery. Then take an 81 mg Aspirin once a day for three weeks. Then discontinue Aspirin. ?You may resume your vitamins/supplements upon discharge from the hospital. ?Do not take any NSAIDs (Advil, Aleve, Ibuprofen, Meloxicam, etc.) until you have discontinued the 325 mg Aspirin. ? ?HOME CARE INSTRUCTIONS  ?Remove items at home which could result in a fall. This includes throw rugs or furniture in walking pathways.  ?ICE to the affected knee as much as tolerated. Icing helps control swelling. If the swelling is well controlled you will be more comfortable and rehab easier. Continue to use ice on the knee for pain and swelling from surgery. You may notice swelling that will progress down to the foot and ankle. This is normal after surgery. Elevate the leg when you are not up walking on it.    ?Continue to use the breathing machine which will help keep your temperature down. It is common for your temperature to cycle up and down following surgery, especially at night when you are not up moving around and exerting yourself. The breathing machine keeps your lungs expanded and your temperature down. ?Do  not place pillow under the operative knee, focus on keeping the knee straight while resting ? ?DIET ?You may resume your previous home diet once you are discharged from the hospital. ? ?DRESSING / WOUND CARE / SHOWERING ?Keep your bulky bandage on for 2 days. On the third post-operative day you may remove the Ace bandage and gauze. There is a waterproof adhesive bandage on your skin which will stay in place until your first follow-up appointment. Once you remove this you will not need to place another bandage ?You may begin showering 3 days following surgery, but do not submerge the incision under water. ? ?ACTIVITY ?For the first 5 days, the key is rest and control of pain and swelling ?Do your home exercises twice a day starting on post-operative day 3. On the days you go to physical therapy, just do the home exercises once that day. ?You should rest, ice and elevate the leg for 50 minutes out of every hour. Get up and walk/stretch for 10 minutes per hour. After 5 days you can increase your activity slowly as tolerated. ?Walk with your walker as instructed. Use the walker until you are comfortable transitioning to a cane. Walk with the cane in the opposite hand of the operative leg. You may discontinue the cane once you are comfortable and walking steadily. ?Avoid periods of inactivity such as sitting longer than an hour when not asleep. This helps prevent blood clots.  ?You may discontinue the knee immobilizer once you are able to perform a straight leg raise while lying down. ?You may resume a sexual relationship in one month or when  given the OK by your doctor.  ?You may return to work once you are cleared by your doctor.  ?Do not drive a car for 6 weeks or until released by your surgeon.  ?Do not drive while taking narcotics. ? ?TED HOSE STOCKINGS ?Wear the elastic stockings on both legs for three weeks following surgery during the day. You may remove them at night for sleeping. ? ?WEIGHT BEARING ?Weight  bearing as tolerated with assist device (walker, cane, etc) as directed, use it as long as suggested by your surgeon or therapist, typically at least 4-6 weeks. ? ?POSTOPERATIVE CONSTIPATION PROTOCOL ?Constipation - defined medically as fewer than three stools per week and severe constipation as less than one stool per week. ? ?One of the most common issues patients have following surgery is constipation.  Even if you have a regular bowel pattern at home, your normal regimen is likely to be disrupted due to multiple reasons following surgery.  Combination of anesthesia, postoperative narcotics, change in appetite and fluid intake all can affect your bowels.  In order to avoid complications following surgery, here are some recommendations in order to help you during your recovery period. ? ?Colace (docusate) - Pick up an over-the-counter form of Colace or another stool softener and take twice a day as long as you are requiring postoperative pain medications.  Take with a full glass of water daily.  If you experience loose stools or diarrhea, hold the colace until you stool forms back up. If your symptoms do not get better within 1 week or if they get worse, check with your doctor. ?Dulcolax (bisacodyl) - Pick up over-the-counter and take as directed by the product packaging as needed to assist with the movement of your bowels.  Take with a full glass of water.  Use this product as needed if not relieved by Colace only.  ?MiraLax (polyethylene glycol) - Pick up over-the-counter to have on hand. MiraLax is a solution that will increase the amount of water in your bowels to assist with bowel movements.  Take as directed and can mix with a glass of water, juice, soda, coffee, or tea. Take if you go more than two days without a movement. Do not use MiraLax more than once per day. Call your doctor if you are still constipated or irregular after using this medication for 7 days in a row. ? ?If you continue to have problems  with postoperative constipation, please contact the office for further assistance and recommendations.  If you experience "the worst abdominal pain ever" or develop nausea or vomiting, please contact the office immediatly for further recommendations for treatment. ? ?ITCHING ?If you experience itching with your medications, try taking only a single pain pill, or even half a pain pill at a time.  You can also use Benadryl over the counter for itching or also to help with sleep.  ? ?MEDICATIONS ?See your medication summary on the ?After Visit Summary? that the nursing staff will review with you prior to discharge.  You may have some home medications which will be placed on hold until you complete the course of blood thinner medication.  It is important for you to complete the blood thinner medication as prescribed by your surgeon.  Continue your approved medications as instructed at time of discharge. ? ?PRECAUTIONS ?If you experience chest pain or shortness of breath - call 911 immediately for transfer to the hospital emergency department.  ?If you develop a fever greater that 101 F, purulent drainage  from wound, increased redness or drainage from wound, foul odor from the wound/dressing, or calf pain - CONTACT YOUR SURGEON.   ?                                                ?FOLLOW-UP APPOINTMENTS ?Make sure you keep all of your appointments after your operation with your surgeon and caregivers. You should call the office at the above phone number and make an appointment for approximately two weeks after the date of your surgery or on the date instructed by your surgeon outlined in the "After Visit Summary". ? ?RANGE OF MOTION AND STRENGTHENING EXERCISES  ?Rehabilitation of the knee is important following a knee injury or an operation. After just a few days of immobilization, the muscles of the thigh which control the knee become weakened and shrink (atrophy). Knee exercises are designed to build up the tone and  strength of the thigh muscles and to improve knee motion. Often times heat used for twenty to thirty minutes before working out will loosen up your tissues and help with improving the range of motion but do

## 2021-11-28 NOTE — Brief Op Note (Signed)
11/28/2021 ? ?4:06 PM ? ?PATIENT:  Charles Lara  61 y.o. male ? ?PRE-OPERATIVE DIAGNOSIS:  Failed right total knee arthroplasty ? ?POST-OPERATIVE DIAGNOSIS:  Failed right total knee arthroplasty ? ?PROCEDURE:  Procedure(s): ?TOTAL KNEE REVISION (Right) ? ?SURGEON:  Surgeon(s) and Role: ?   Gaynelle Arabian, MD - Primary ? ?PHYSICIAN ASSISTANT:  ? ?ASSISTANTS: Molli Barrows, PA-C  ? ?ANESTHESIA:   spinal and adductor canal block ? ?EBL:  50 mL  ? ?BLOOD ADMINISTERED:none ? ?DRAINS: none  ? ?LOCAL MEDICATIONS USED:  OTHER Exparel ? ?COUNTS:  YES ? ?TOURNIQUET:   ?Total Tourniquet Time Documented: ?Thigh (Right) - 99 minutes ?Total: Thigh (Right) - 99 minutes ? ? ?DICTATION: .Other Dictation: Dictation Number [id number] ? ?PLAN OF CARE: Admit to inpatient  ? ?PATIENT DISPOSITION:  PACU - hemodynamically stable. ?  ? ? ?

## 2021-11-28 NOTE — Progress Notes (Signed)
Orthopedic Tech Progress Note ?Patient Details:  ?Charles Lara ?16-May-1961 ?[id number] ? ?Patient ID: Charles Lara, male   DOB: 31-Jan-1961, 61 y.o.   MRN: [id number] ? ?Charles Lara ?11/28/2021, 5:18 PM ?Cpm applied in pacu ?

## 2021-11-28 NOTE — Transfer of Care (Signed)
Immediate Anesthesia Transfer of Care Note ? ?Patient: Charles Lara ? ?Procedure(s) Performed: TOTAL KNEE REVISION (Right: Knee) ? ?Patient Location: PACU ? ?Anesthesia Type:General ? ?Level of Consciousness: sedated, patient cooperative and responds to stimulation ? ?Airway & Oxygen Therapy: Patient Spontanous Breathing and Patient connected to face mask oxygen ? ?Post-op Assessment: Report given to RN and Post -op Vital signs reviewed and stable ? ?Post vital signs: Reviewed and stable ? ?Last Vitals:  ?Vitals Value Taken Time  ?BP 141/74 11/28/21 1648  ?Temp    ?Pulse 69 11/28/21 1649  ?Resp 12 11/28/21 1649  ?SpO2 98 % 11/28/21 1649  ?Vitals shown include unvalidated device data. ? ?Last Pain:  ?Vitals:  ? 11/28/21 1334  ?TempSrc:   ?PainSc: 0-No pain  ?   ? ?  ? ?Complications: No notable events documented. ?

## 2021-11-29 LAB — BASIC METABOLIC PANEL
Anion gap: 3 — ABNORMAL LOW (ref 5–15)
BUN: 28 mg/dL — ABNORMAL HIGH (ref 8–23)
CO2: 27 mmol/L (ref 22–32)
Calcium: 8.2 mg/dL — ABNORMAL LOW (ref 8.9–10.3)
Chloride: 106 mmol/L (ref 98–111)
Creatinine, Ser: 1.07 mg/dL (ref 0.61–1.24)
GFR, Estimated: >60 mL/min (ref >60)
Glucose, Bld: 124 mg/dL — ABNORMAL HIGH (ref 70–99)
Potassium: 4.3 mmol/L (ref 3.5–5.1)
Sodium: 136 mmol/L (ref 135–145)

## 2021-11-29 LAB — CBC
HCT: 31.9 % — ABNORMAL LOW (ref 39.0–52.0)
Hemoglobin: 9.8 g/dL — ABNORMAL LOW (ref 13.0–17.0)
MCH: 29.4 pg (ref 26.0–34.0)
MCHC: 30.7 g/dL (ref 30.0–36.0)
MCV: 95.8 fL (ref 80.0–100.0)
Platelets: 254 10*3/uL (ref 150–400)
RBC: 3.33 MIL/uL — ABNORMAL LOW (ref 4.22–5.81)
RDW: 20 % — ABNORMAL HIGH (ref 11.5–15.5)
WBC: 8.7 10*3/uL (ref 4.0–10.5)
nRBC: 0 % (ref 0.0–0.2)

## 2021-11-29 MED ORDER — ALUM & MAG HYDROXIDE-SIMETH 200-200-20 MG/5ML PO SUSP
30.0000 mL | Freq: Four times a day (QID) | ORAL | Status: DC | PRN
  Administered 2021-11-29: 30 mL via ORAL
  Filled 2021-11-29: qty 30

## 2021-11-29 NOTE — Evaluation (Signed)
Physical Therapy Evaluation ?Patient Details ?Name: Charles Lara ?MRN: [id number] ?DOB: [birth year] ?Today's Date: 11/29/2021 ? ?History of Present Illness ? Pt s/p R TKR revision and with hx of bil UKR, ADHD, lumbar fusions with 5 back surgeries, L foot drop  ?Clinical Impression ? Pt s/p R TKR revision and presents with decreased R LE strength/ROM, post op pain, decreased L LE strength and ankle ROM, and balance deficits.  Pt hopes to progress to dc home with intermittent assist of family and would benefit from follow up HHPT as he will not have transportation to OP PT.  Pt currently struggling with sit<>stand and will have difficulty negotiating stairs "my right leg is the one that stands me up and takes me up the stairs". ?   ? ?Recommendations for follow up therapy are one component of a multi-disciplinary discharge planning process, led by the attending physician.  Recommendations may be updated based on patient status, additional functional criteria and insurance authorization. ? ?Follow Up Recommendations Home health PT ? ?  ?Assistance Recommended at Discharge Frequent or constant Supervision/Assistance  ?Patient can return home with the following ? A lot of help with walking and/or transfers;A little help with bathing/dressing/bathroom;Assistance with cooking/housework;Assist for transportation;Help with stairs or ramp for entrance ? ?  ?Equipment Recommendations None recommended by PT  ?Recommendations for Other Services ?    ?  ?Functional Status Assessment Patient has had a recent decline in their functional status and demonstrates the ability to make significant improvements in function in a reasonable and predictable amount of time.  ? ?  ?Precautions / Restrictions Precautions ?Precautions: Knee;Fall ?Required Braces or Orthoses: Knee Immobilizer - Right;Other Brace ?Knee Immobilizer - Right: Discontinue once straight leg raise with < 10 degree lag (Pt wears bil foot braces but states at home for  limited distance often just wears his Crocs) ?Restrictions ?Weight Bearing Restrictions: No ?Other Position/Activity Restrictions: WBAT  ? ?  ? ?Mobility ? Bed Mobility ?Overal bed mobility: Needs Assistance ?Bed Mobility: Supine to Sit ?  ?  ?Supine to sit: Min assist ?  ?  ?General bed mobility comments: cues for sequence and use of L LE to self assist ?  ? ?Transfers ?Overall transfer level: Needs assistance ?Equipment used: Rolling walker (2 wheels) ?Transfers: Sit to/from Stand ?Sit to Stand: Mod assist ?  ?  ?  ?  ?  ?General transfer comment: cues for LE management and use of UEs to self assist;  Physical assist to bring wt up and fwd and to balance in intial standing ?  ? ?Ambulation/Gait ?Ambulation/Gait assistance: Min assist ?Gait Distance (Feet): 70 Feet ?Assistive device: Rolling walker (2 wheels) ?Gait Pattern/deviations: Step-to pattern, Decreased step length - right, Decreased step length - left, Shuffle, Trunk flexed ?Gait velocity: decr ?  ?  ?General Gait Details: cues for sequence, posture and position from RW ? ?Stairs ?  ?  ?  ?  ?  ? ?Wheelchair Mobility ?  ? ?Modified Rankin (Stroke Patients Only) ?  ? ?  ? ?Balance Overall balance assessment: Needs assistance ?Sitting-balance support: No upper extremity supported, Feet supported ?Sitting balance-Leahy Scale: Good ?  ?  ?Standing balance support: Bilateral upper extremity supported ?Standing balance-Leahy Scale: Poor ?  ?  ?  ?  ?  ?  ?  ?  ?  ?  ?  ?  ?   ? ? ? ?Pertinent Vitals/Pain Pain Assessment ?Pain Assessment: 0-10 ?Pain Score: 8  ?Pain Location: R knee ?Pain Descriptors /  Indicators: Aching, Sore ?Pain Intervention(s): Limited activity within patient's tolerance, Monitored during session, Premedicated before session, Ice applied  ? ? ?Home Living Family/patient expects to be discharged to:: Private residence ?Living Arrangements: Alone ?Available Help at Discharge: Available PRN/intermittently;Family ?Type of Home: House ?Home  Access: Stairs to enter ?Entrance Stairs-Rails: None ?Entrance Stairs-Number of Steps: 2+1 ?  ?Home Layout: One level ?Home Equipment: Agricultural consultant (2 wheels);Cane - single point ?   ?  ?Prior Function Prior Level of Function : Independent/Modified Independent ?  ?  ?  ?  ?  ?  ?Mobility Comments: used cane as needed ?  ?  ? ? ?Hand Dominance  ?   ? ?  ?Extremity/Trunk Assessment  ? Upper Extremity Assessment ?Upper Extremity Assessment: Overall WFL for tasks assessed ?  ? ?Lower Extremity Assessment ?Lower Extremity Assessment: RLE deficits/detail;LLE deficits/detail ?RLE Deficits / Details: AAROM at knee -5 - 45 wtih 3-/5 quads ?LLE Deficits / Details: 4/5 strength with knee flexion limited to ~90 and limited ankle DF with joint notable pronation at foot ?  ? ?Cervical / Trunk Assessment ?Cervical / Trunk Assessment: Normal  ?Communication  ? Communication: No difficulties  ?Cognition Arousal/Alertness: Awake/alert ?Behavior During Therapy: [facility] for tasks assessed/performed ?Overall Cognitive Status: Within Functional Limits for tasks assessed ?  ?  ?  ?  ?  ?  ?  ?  ?  ?  ?  ?  ?  ?  ?  ?  ?  ?  ?  ? ?  ?General Comments   ? ?  ?Exercises Total Joint Exercises ?Ankle Circles/Pumps: AROM, Both, AAROM, 15 reps, Supine ?Quad Sets: AROM, Both, 10 reps, Supine ?Heel Slides: AAROM, Right, 15 reps, Supine ?Straight Leg Raises: AAROM, Right, 10 reps, Supine  ? ?Assessment/Plan  ?  ?PT Assessment Patient needs continued PT services  ?PT Problem List Decreased strength;Decreased range of motion;Decreased activity tolerance;Decreased balance;Decreased mobility;Decreased knowledge of use of DME;Pain ? ?   ?  ?PT Treatment Interventions DME instruction;Gait training;Stair training;Functional mobility training;Therapeutic activities;Therapeutic exercise;Patient/family education   ? ?PT Goals (Current goals can be found in the Care Plan section)  ?Acute Rehab PT Goals ?Patient Stated Goal: Regain IND ?PT Goal Formulation: With  patient ?Time For Goal Achievement: 12/06/21 ?Potential to Achieve Goals: Good ? ?  ?Frequency 7X/week ?  ? ? ?Co-evaluation   ?  ?  ?  ?  ? ? ?  ?AM-PAC PT "6 Clicks" Mobility  ?Outcome Measure Help needed turning from your back to your side while in a flat bed without using bedrails?: A Little ?Help needed moving from lying on your back to sitting on the side of a flat bed without using bedrails?: A Little ?Help needed moving to and from a bed to a chair (including a wheelchair)?: A Little ?Help needed standing up from a chair using your arms (e.g., wheelchair or bedside chair)?: A Lot ?Help needed to walk in hospital room?: A Little ?Help needed climbing 3-5 steps with a railing? : A Lot ?6 Click Score: 16 ? ?  ?End of Session Equipment Utilized During Treatment: Gait belt;Right knee immobilizer ?Activity Tolerance: Patient tolerated treatment well ?Patient left: in chair;with call bell/phone within reach;with chair alarm set ?Nurse Communication: Mobility status ?PT Visit Diagnosis: Difficulty in walking, not elsewhere classified (R26.2) ?  ? ?Time: 5102-5852 ?PT Time Calculation (min) (ACUTE ONLY): 50 min ? ? ?Charges:   PT Evaluation ?$PT Eval Low Complexity: 1 Low ?PT Treatments ?$Gait Training: 8-22 mins ?$  Therapeutic Exercise: 8-22 mins ?  ?   ? ? ?Mauro Kaufmann PT ?Acute Rehabilitation Services ?Pager [phone] ?Office [phone] ? ? ?Charles Lara ?11/29/2021, 12:42 PM ? ?

## 2021-11-29 NOTE — Progress Notes (Signed)
? ?  Subjective: ?1 Day Post-Op Procedure(s) (LRB): ?TOTAL KNEE REVISION (Right) ?Patient seen in rounds by Dr. Wynelle Link. ?Patient reports pain as mild.   ?Patient is well, and has had no acute complaints or problems. Foley cath removed. Denies SOB or chest pain. We will start physical therapy today. ? ?Objective: ?Vital signs in last 24 hours: ?Temp:  [97.8 ?F (36.6 ?C)-98.8 ?F (37.1 ?C)] 97.8 ?F (36.6 ?C) (03/30 0535) ?Pulse Rate:  [66-89] 66 (03/30 0535) ?Resp:  [10-19] 17 (03/30 0535) ?BP: (96-152)/(62-88) 96/62 (03/30 0535) ?SpO2:  [95 %-100 %] 98 % (03/30 0535) ?Weight:  [89 kg] 89 kg (03/29 1749) ? ?Intake/Output from previous day: ? ?Intake/Output Summary (Last 24 hours) at 11/29/2021 0731 ?Last data filed at 11/29/2021 0555 ?Gross per 24 hour  ?Intake 2506.25 ml  ?Output 1550 ml  ?Net 956.25 ml  ?  ? ?Intake/Output this shift: ?No intake/output data recorded. ? ?Labs: ?Recent Labs  ?  11/29/21 ?0332  ?HGB 9.8*  ? ?Recent Labs  ?  11/29/21 ?0332  ?WBC 8.7  ?RBC 3.33*  ?HCT 31.9*  ?PLT 254  ? ?Recent Labs  ?  11/29/21 ?0332  ?NA 136  ?K 4.3  ?CL 106  ?CO2 27  ?BUN 28*  ?CREATININE 1.07  ?GLUCOSE 124*  ?CALCIUM 8.2*  ? ?No results for input(s): LABPT, INR in the last 72 hours. ? ?Exam: ?General - Patient is Alert and Oriented ?Extremity - Neurologically intact ?Neurovascular intact ?Sensation intact distally ?Dorsiflexion/Plantar flexion intact ?Dressing - dressing C/D/I ?Motor Function - intact, moving foot and toes well on exam. ? ?Past Medical History:  ?Diagnosis Date  ? Anemia   ? Arthritis   ? Attention deficit disorder   ? Diaphoresis   ? Dyspnea   ? GERD (gastroesophageal reflux disease)   ? Heart murmur   ? High blood pressure   ? not on meds in at least 4 months per pt stated MD took him off of meds  ? High cholesterol   ? History of kidney stones   ? ? ?Assessment/Plan: ?1 Day Post-Op Procedure(s) (LRB): ?TOTAL KNEE REVISION (Right) ?Principal Problem: ?  Failed total knee arthroplasty ([place]) ?Active  Problems: ?  Failed total right knee replacement ([place]) ? ?Estimated body mass index is 28.98 kg/m? as calculated from the following: ?  Height as of this encounter: 5\' 9"  (1.753 m). ?  Weight as of this encounter: 89 kg. ?Advance diet ?Up with therapy ?D/C IV fluids ? ?Anticipated LOS equal to or greater than 2 midnights due to ? - Expected need for hospital services (PT, OT, Nursing) required for safe discharge ? - Active co-morbidities: None ?OR  ? ?- Unanticipated findings during/Post Surgery: None  ?- Patient is a high risk of re-admission due to: None  ? ?DVT Prophylaxis - Aspirin ?Weight bearing as tolerated. ?Start physical therapy. ? ?Plan is to go Home after hospital stay. Expected discharge tomorrow. He will start to work with physical therapy today. After hospital stay he will go to [facility] in [place]. He will follow-up in clinic in 2 weeks. ? ?R. Jaynie Bream, PA-C ?Orthopedic Surgery ?[phone] ?11/29/2021, 7:31 AM  ?

## 2021-11-29 NOTE — Progress Notes (Signed)
Physical Therapy Treatment ?Patient Details ?Name: Charles Lara ?MRN: [id number] ?DOB: 1961/06/06 ?Today's Date: 11/29/2021 ? ? ?History of Present Illness Pt s/p R TKR revision and with hx of bil UKR, ADHD, lumbar fusions with 5 back surgeries, L foot drop ? ?  ?PT Comments  ? ? Pt very cooperative but continues to struggle with sit<>stand requiring mod assist to get out of chair.  Pt hopes to progress to dc home with limited assist of family.   ?Recommendations for follow up therapy are one component of a multi-disciplinary discharge planning process, led by the attending physician.  Recommendations may be updated based on patient status, additional functional criteria and insurance authorization. ? ?Follow Up Recommendations ? Home health PT ?  ?  ?Assistance Recommended at Discharge Frequent or constant Supervision/Assistance  ?Patient can return home with the following A lot of help with walking and/or transfers;A little help with bathing/dressing/bathroom;Assistance with cooking/housework;Assist for transportation;Help with stairs or ramp for entrance ?  ?Equipment Recommendations ? None recommended by PT  ?  ?Recommendations for Other Services   ? ? ?  ?Precautions / Restrictions Precautions ?Precautions: Knee;Fall ?Required Braces or Orthoses: Knee Immobilizer - Right;Other Brace ?Knee Immobilizer - Right: Discontinue once straight leg raise with < 10 degree lag ?Restrictions ?Weight Bearing Restrictions: No ?Other Position/Activity Restrictions: WBAT  ?  ? ?Mobility ? Bed Mobility ?Overal bed mobility: Needs Assistance ?Bed Mobility: Sit to Supine ?  ?  ?Supine to sit: Min assist ?Sit to supine: Min guard ?  ?General bed mobility comments: cues for sequence and use of L LE to self assist ?  ? ?Transfers ?Overall transfer level: Needs assistance ?Equipment used: Rolling walker (2 wheels) ?Transfers: Sit to/from Stand ?Sit to Stand: Mod assist ?  ?  ?  ?  ?  ?General transfer comment: cues for LE management and  use of UEs to self assist;  Physical assist to bring wt up and fwd and to balance in intial standing ?  ? ?Ambulation/Gait ?Ambulation/Gait assistance: Min assist ?Gait Distance (Feet): 100 Feet ?Assistive device: Rolling walker (2 wheels) ?Gait Pattern/deviations: Step-to pattern, Decreased step length - right, Decreased step length - left, Shuffle, Trunk flexed ?Gait velocity: decr ?  ?  ?General Gait Details: cues for sequence, posture and position from RW ? ? ?Stairs ?  ?  ?  ?  ?  ? ? ?Wheelchair Mobility ?  ? ?Modified Rankin (Stroke Patients Only) ?  ? ? ?  ?Balance Overall balance assessment: Needs assistance ?Sitting-balance support: No upper extremity supported, Feet supported ?Sitting balance-Leahy Scale: Good ?  ?  ?Standing balance support: Bilateral upper extremity supported ?Standing balance-Leahy Scale: Poor ?  ?  ?  ?  ?  ?  ?  ?  ?  ?  ?  ?  ?  ? ?  ?Cognition Arousal/Alertness: Awake/alert ?Behavior During Therapy: [facility] for tasks assessed/performed ?Overall Cognitive Status: Within Functional Limits for tasks assessed ?  ?  ?  ?  ?  ?  ?  ?  ?  ?  ?  ?  ?  ?  ?  ?  ?  ?  ?  ? ?  ?Exercises Total Joint Exercises ?Ankle Circles/Pumps: AROM, Both, AAROM, 15 reps, Supine ?Quad Sets: AROM, Both, 10 reps, Supine ?Heel Slides: AAROM, Right, 15 reps, Supine ?Straight Leg Raises: AAROM, Right, 10 reps, Supine ? ?  ?General Comments   ?  ?  ? ?Pertinent Vitals/Pain Pain Assessment ?Pain Assessment: 0-10 ?Pain  Score: 6  ?Pain Location: R knee ?Pain Descriptors / Indicators: Aching, Sore ?Pain Intervention(s): Limited activity within patient's tolerance, Monitored during session, Premedicated before session  ? ? ?Home Living Family/patient expects to be discharged to:: Private residence ?Living Arrangements: Alone ?Available Help at Discharge: Available PRN/intermittently;Family ?Type of Home: House ?Home Access: Stairs to enter ?Entrance Stairs-Rails: None ?Entrance Stairs-Number of Steps: 2+1 ?  ?Home  Layout: One level ?Home Equipment: Agricultural consultant (2 wheels);Cane - single point ?   ?  ?Prior Function    ?  ?  ?   ? ?PT Goals (current goals can now be found in the care plan section) Acute Rehab PT Goals ?Patient Stated Goal: Regain IND ?PT Goal Formulation: With patient ?Time For Goal Achievement: 12/06/21 ?Potential to Achieve Goals: Good ?Progress towards PT goals: Progressing toward goals ? ?  ?Frequency ? ? ? 7X/week ? ? ? ?  ?PT Plan Current plan remains appropriate  ? ? ?Co-evaluation   ?  ?  ?  ?  ? ?  ?AM-PAC PT "6 Clicks" Mobility   ?Outcome Measure ? Help needed turning from your back to your side while in a flat bed without using bedrails?: A Little ?Help needed moving from lying on your back to sitting on the side of a flat bed without using bedrails?: A Little ?Help needed moving to and from a bed to a chair (including a wheelchair)?: A Little ?Help needed standing up from a chair using your arms (e.g., wheelchair or bedside chair)?: A Lot ?Help needed to walk in hospital room?: A Little ?Help needed climbing 3-5 steps with a railing? : A Lot ?6 Click Score: 16 ? ?  ?End of Session Equipment Utilized During Treatment: Gait belt;Right knee immobilizer ?Activity Tolerance: Patient tolerated treatment well ?Patient left: with call bell/phone within reach;in bed;with bed alarm set ?Nurse Communication: Mobility status ?PT Visit Diagnosis: Difficulty in walking, not elsewhere classified (R26.2) ?  ? ? ?Time: 6712-4580 ?PT Time Calculation (min) (ACUTE ONLY): 26 min ? ?Charges:  $Gait Training: 23-37 mins ?$Therapeutic Exercise: 8-22 mins          ?          ? ?Mauro Kaufmann PT ?Acute Rehabilitation Services ?Pager [phone] ?Office [phone] ? ? ? ?Jeiry Birnbaum ?11/29/2021, 12:52 PM ? ?

## 2021-11-29 NOTE — TOC Transition Note (Signed)
Transition of Care (TOC) - CM/SW Discharge Note ? ? ?Patient Details  ?Name: Charles Lara ?MRN: [id number] ?Date of Birth: [birth year] ? ?Transition of Care (TOC) CM/SW Contact:  ?Chessie Neuharth, LCSW ?Phone Number: ?11/29/2021, 2:54 PM ? ? ?Clinical Narrative:    ? ?Met with pt today to review dc needs.  Per chart review, pt had been referred for OPPT follow up, however, pt reports that he has no transportation assist available to get to appointments.  Pt requesting HHPT - alerted ortho PA, Theresa Duty, who has placed orders for HHPT.  No agency preference.  Referral placed with [facility].  No DME needs.  No further TOC needs. ? ?Final next level of care: [place] ?Barriers to Discharge: No Barriers Identified ? ? ?Patient Goals and CMS Choice ?Patient states their goals for this hospitalization and ongoing recovery are:: return home ?  ?  ? ?Discharge Placement ?  ?           ?  ?  ?  ?  ? ?Discharge Plan and Services ?  ?  ?           ?DME Arranged: N/A ?DME Agency: NA ?  ?  ?  ?HH Arranged: PT ?[place] Agency: [place] ?Date HH Agency Contacted: 11/29/21 ?Time [place]: [id number] ?Representative spoke with at [place]: [name] ? ?Social Determinants of Health (SDOH) Interventions ?  ? ? ?Readmission Risk Interventions ? ?  11/29/2021  ?  2:52 PM  ?Readmission Risk Prevention Plan  ?Post Dischage Appt Complete  ?Medication Screening Complete  ? ? ? ? ? ?

## 2021-11-29 NOTE — Op Note (Signed)
NAME: Charles Lara, Charles Lara ?MEDICAL RECORD NO: [id number] ?ACCOUNT NO: [id number] ?DATE OF BIRTH: 03-Jan-1961 ?FACILITY: [facility] ?LOCATION: [facility] ?PHYSICIAN: Gus RankinFrank V. Shaine Mount, MD ? ?Operative Report  ? ?DATE OF PROCEDURE: 11/28/2021 ? ?PREOPERATIVE DIAGNOSIS:  Failed right total knee arthroplasty. ? ?POSTOPERATIVE DIAGNOSIS:  Failed right total knee arthroplasty. ? ?PROCEDURE:  Right total knee arthroplasty revision. ? ?SURGEON:  Gus RankinFrank V. Zae Kirtz, MD ? ?ASSISTANT:  Jodene NamStephen Chabon, PA-C. ? ?ANESTHESIA:  Adductor canal block and general. ? ?ESTIMATED BLOOD LOSS:  50  ? ?DRAINS:  None. ? ?TOURNIQUET TIME:  99 minutes at 300 mmHg. ? ?COMPLICATIONS:  None. ? ?CONDITION:  Stable to recovery. ? ?BRIEF CLINICAL NOTE:  The patient is a 61 year old male with long history regards to his right knee.  He has had a previous revision over 10 years ago.  He presented to the office with significant pain in his right knee and a varus deformity.  He has  ?obvious tibial loosening.  He presents now for revision of the knee.  Infection workup was negative. ? ?DESCRIPTION OF PROCEDURE:  After successful administration of adductor canal block and general anesthetic, a tourniquet was placed on his right thigh and his right lower extremity prepped and draped in the usual sterile fashion.  Extremity was wrapped in ? Esmarch, knee flexed, tourniquet inflated to 300 mmHg.  Midline incision was made with a 10 blade through subcutaneous tissue to the extensor mechanism.  A fresh blade was used to make a medial parapatellar arthrotomy.  Soft tissue on the proximal  ?medial tibia subperiosteally elevated the joint line with a knife and into the semimembranosus bursa with a Cobb elevator.  He has a lot of metal stained tissue in the knee, but no evidence of any significant fluid.  I debrided the scar and excised the  ?scar from under his extensor mechanism both medial, lateral and in the infrapatellar region.  Once I did this, I was easily able to evert the  patella and flex the knee 90 degrees.  I did a patelloplasty to expose the component and the patellar component  ?essentially had no wear and is well fixed, so that was left intact.  Tibia component had gone into significant varus and significant posterior angulation.  I was able to remove the tibial component relatively easily.  I then removed the cement from the  ?tibial canal.  I gained access to the canal and thoroughly irrigated it.  I reamed up to 14 mm for 13 mm cemented stem.  We then placed a size 4 cement restrictor to the appropriate depth in the tibial canal. ? ?Circumferential retraction was placed around the tibia, which was subluxed forward.  Extramedullary tibial alignment guide was placed, referencing proximally at the medial aspect of the tibial tubercle and distally along the second metatarsal axis and  ?tibial crest.  Block was pinned to remove about 2 to 3 mm off the non-deficient lateral side.  He had a massive medial defect.  I was going to take a 15 mm augment to get down to normal bone.  So, I cut for a 15 mm augment medially.  We then prepared the ? proximal tibia for a 29 sleeve.  Trial tibia was then placed, size 4 MBT tibia with a 13 x 30 stem, a 29 sleeve and a 15 mm augment medially.  This had great fit on the tibia.  This also had great alignment. ? ?We then prepared the femur.  The osteotome was used to disrupt the interface between  the femoral component and bone and the component was removed with minimal to no bone loss.  We gained access to the femoral canal and thoroughly irrigated it.  We reamed ? to 18 mm for an 18 mm press-fit stem.  That reamer was left in place to serve as our alignment guide.  Distal femoral cutting blocks placed and had go in the +4 position to get any bone off the medial and lateral side and the cuts were made.  We thus  ?needed 4 mm augments medial and lateral.  The size 4 was most appropriate for the femur.  The size 4 cutting blocks placed in the +2  position and the rotation was marked off the epicondylar axis and rotation was confirmed by creating a rectangular  ?flexion gap at 90 degrees.  A block was pinned in this rotation.  Anterior, there was minimal to no bone resection and chamfer cuts show no resection.  I did go into +8 position posteriorly to get any bone, thus 8 mm posterior augments were used on each  ?side.  Intercondylar block was placed to make the intercondylar cut for the TC3 component. ? ?The femoral trial was built.  To get the joint line back down to normal position with 8 mm distal augments, with 8 mm posterior augments.  All of these were medial and lateral. Size 4 posterior stabilized femur with an 18 x 75 stem and +2 position and 5  ?degrees of valgus.  The femoral component trial was then placed with excellent fit.  I had to go to a 25 mm insert to allow for full extension with excellent varus, valgus, anterior, posterior balance throughout full range of motion.  The components were ? then subsequently assembled on the back table.  Once again, the tibial component a size 4 MBT revision tray with a 13 x 30 stem, 29 sleeve and 15 mm augment medially. On the femoral side, it is a size 4 TC3 femur with 8 mm distal augments medial and  ?lateral and 8 mm posterior augments medial and lateral, an 18 x 75 stem and a +2 position in 5 degrees of valgus.  The cut bone surfaces were prepared with pulsatile lavage while the cement was mixed.  Once ready for implantation, the tibia side was  ?cemented first.  We cemented the entire tibial component in place.  It was impacted and all extruded cement removed.  On the femoral side, we cemented distally with a press-fit stem.  We impacted the femoral component and removed the extruded cement.  A  ?25 mm trial insert was placed.  The knee held in full extension and again all extruded cement removed.  Once cement was fully hardened, the knee permanent 25 mm TC3 rotating platform insert was placed into the  tibial tray.  Flexion against gravity shows  ?about 135 degrees.  Patellar tracking normally.  Wound was copiously irrigated with saline solution.  20 mL of Exparel mixed with 60 mL of saline was injected into the extensor mechanism and the periosteum of the femur.  The arthrotomy was then closed  ?with a running 0 Stratafix suture.  Flexion against gravity was 130 degrees.  Patella tracks normally and there is great stability throughout full range of motion.  Tourniquet was released, total time 99 minutes.  Minor bleeding stopped with  ?electrocautery.  Subcutaneous was closed in interrupted 2-0 Vicryl, skin closed with staples.  Incision was cleaned and dried and Steri-Strips and a sterile dressing applied.  He was placed into a knee immobilizer, awakened, and transported to recovery  ?in stable condition. ? ?Note that a surgical assistant was of medical necessity for this procedure to do it in a safe and expeditious manner.  Surgical assistant was necessary for retraction of vital ligaments and neurovascular structures and for proper positioning of the limb  ?for safe removal of the old implant and for safe and accurate placement of the new implant. ? ? ? ? ? ?PAA ?D: 11/28/2021 4:14:20 pm T: 11/29/2021 1:07:00 am  ?JOB: [id number]/ [id number]  ?

## 2021-11-29 NOTE — Plan of Care (Signed)
Problem: Education: ?Goal: Knowledge of General Education information will improve ?Description: Including pain rating scale, medication(s)/side effects and non-pharmacologic comfort measures ?Outcome: Progressing ?  ?Problem: Clinical Measurements: ?Goal: Ability to maintain clinical measurements within normal limits will improve ?Outcome: Progressing ?  ?Problem: Pain Managment: ?Goal: General experience of comfort will improve ?Outcome: Progressing ?  ?Haydee Salter, RN ?11/29/21 ?7:44 AM ? ?

## 2021-11-30 ENCOUNTER — Other Ambulatory Visit (HOSPITAL_COMMUNITY): Payer: Self-pay

## 2021-11-30 ENCOUNTER — Encounter (HOSPITAL_COMMUNITY): Payer: Self-pay | Admitting: Orthopedic Surgery

## 2021-11-30 LAB — BASIC METABOLIC PANEL
Anion gap: 4 — ABNORMAL LOW (ref 5–15)
BUN: 27 mg/dL — ABNORMAL HIGH (ref 8–23)
CO2: 28 mmol/L (ref 22–32)
Calcium: 8.7 mg/dL — ABNORMAL LOW (ref 8.9–10.3)
Chloride: 105 mmol/L (ref 98–111)
Creatinine, Ser: 0.94 mg/dL (ref 0.61–1.24)
GFR, Estimated: >60 mL/min (ref >60)
Glucose, Bld: 90 mg/dL (ref 70–99)
Potassium: 4.2 mmol/L (ref 3.5–5.1)
Sodium: 137 mmol/L (ref 135–145)

## 2021-11-30 LAB — CBC
HCT: 31.9 % — ABNORMAL LOW (ref 39.0–52.0)
Hemoglobin: 9.7 g/dL — ABNORMAL LOW (ref 13.0–17.0)
MCH: 29.5 pg (ref 26.0–34.0)
MCHC: 30.4 g/dL (ref 30.0–36.0)
MCV: 97 fL (ref 80.0–100.0)
Platelets: 248 10*3/uL (ref 150–400)
RBC: 3.29 MIL/uL — ABNORMAL LOW (ref 4.22–5.81)
RDW: 20.4 % — ABNORMAL HIGH (ref 11.5–15.5)
WBC: 9.5 10*3/uL (ref 4.0–10.5)
nRBC: 0 % (ref 0.0–0.2)

## 2021-11-30 MED ORDER — METHOCARBAMOL 500 MG PO TABS
500.0000 mg | ORAL_TABLET | Freq: Four times a day (QID) | ORAL | 0 refills | Status: DC | PRN
  Filled 2021-11-30: qty 40, 10d supply, fill #0

## 2021-11-30 MED ORDER — OXYCODONE HCL 5 MG PO TABS
5.0000 mg | ORAL_TABLET | Freq: Four times a day (QID) | ORAL | 0 refills | Status: DC | PRN
  Filled 2021-11-30: qty 42, 6d supply, fill #0

## 2021-11-30 MED ORDER — ASPIRIN 325 MG PO TBEC
325.0000 mg | DELAYED_RELEASE_TABLET | Freq: Two times a day (BID) | ORAL | 0 refills | Status: AC
  Filled 2021-11-30: qty 38, 19d supply, fill #0

## 2021-11-30 NOTE — Progress Notes (Signed)
Physical Therapy Treatment ?Patient Details ?Name: Charles Lara ?MRN: [id number] ?DOB: October 17, 1960 ?Today's Date: 11/30/2021 ? ? ?History of Present Illness Pt s/p R TKR revision and with hx of bil UKR, ADHD, lumbar fusions with 5 back surgeries, L foot drop ? ?  ?PT Comments  ? ? Pt continues very cooperative and up to ambulate in hall and negotiated single step x 2 with RW and min assist.  Pt performed HEP with assist.  Will return for continued stair training.  Pt limited by non-operative LE limitations.   ?Recommendations for follow up therapy are one component of a multi-disciplinary discharge planning process, led by the attending physician.  Recommendations may be updated based on patient status, additional functional criteria and insurance authorization. ? ?Follow Up Recommendations ? Home health PT ?  ?  ?Assistance Recommended at Discharge Frequent or constant Supervision/Assistance  ?Patient can return home with the following A lot of help with walking and/or transfers;A little help with bathing/dressing/bathroom;Assistance with cooking/housework;Assist for transportation;Help with stairs or ramp for entrance ?  ?Equipment Recommendations ? None recommended by PT  ?  ?Recommendations for Other Services   ? ? ?  ?Precautions / Restrictions Precautions ?Precautions: Knee;Fall ?Required Braces or Orthoses: Knee Immobilizer - Right;Other Brace ?Knee Immobilizer - Right: Discontinue once straight leg raise with < 10 degree lag (Pt performed IND SLR this am) ?Restrictions ?Weight Bearing Restrictions: No ?RLE Weight Bearing: Weight bearing as tolerated ?Other Position/Activity Restrictions: WBAT  ?  ? ?Mobility ? Bed Mobility ?Overal bed mobility: Needs Assistance ?Bed Mobility: Supine to Sit ?  ?  ?Supine to sit: Min guard, Supervision ?  ?  ?General bed mobility comments: cues for sequence and use of L LE to self assist ?  ? ?Transfers ?Overall transfer level: Needs assistance ?Equipment used: Rolling walker (2  wheels) ?Transfers: Sit to/from Stand ?Sit to Stand: Min guard ?  ?  ?  ?  ?  ?General transfer comment: Steady assist with cues for LE management and use of UEs to self assist; ?  ? ?Ambulation/Gait ?Ambulation/Gait assistance: Min guard ?Gait Distance (Feet): 80 Feet ?Assistive device: Rolling walker (2 wheels) ?Gait Pattern/deviations: Step-to pattern, Decreased step length - right, Decreased step length - left, Shuffle, Trunk flexed ?Gait velocity: decr ?  ?  ?General Gait Details: cues for sequence, posture and position from RW ? ? ?Stairs ?Stairs: Yes ?Stairs assistance: Min assist ?Stair Management: No rails, Step to pattern, Forwards, With walker ?Number of Stairs: 2 ?General stair comments: single step twice with cues for sequence ? ? ?Wheelchair Mobility ?  ? ?Modified Rankin (Stroke Patients Only) ?  ? ? ?  ?Balance Overall balance assessment: Needs assistance ?Sitting-balance support: No upper extremity supported, Feet supported ?Sitting balance-Leahy Scale: Good ?  ?  ?Standing balance support: No upper extremity supported ?Standing balance-Leahy Scale: Fair ?  ?  ?  ?  ?  ?  ?  ?  ?  ?  ?  ?  ?  ? ?  ?Cognition Arousal/Alertness: Awake/alert ?Behavior During Therapy: [facility] for tasks assessed/performed ?Overall Cognitive Status: Within Functional Limits for tasks assessed ?  ?  ?  ?  ?  ?  ?  ?  ?  ?  ?  ?  ?  ?  ?  ?  ?  ?  ?  ? ?  ?Exercises Total Joint Exercises ?Ankle Circles/Pumps: AROM, Both, AAROM, 15 reps, Supine ?Quad Sets: AROM, Both, 10 reps, Supine ?Heel Slides: AAROM, Right, 15  reps, Supine ?Straight Leg Raises: AAROM, Right, 10 reps, Supine ?Long Arc Quad: AAROM, AROM, Right, 10 reps, Seated ? ?  ?General Comments   ?  ?  ? ?Pertinent Vitals/Pain Pain Assessment ?Pain Assessment: 0-10 ?Pain Score: 6  ?Pain Location: R knee ?Pain Descriptors / Indicators: Aching, Sore ?Pain Intervention(s): Limited activity within patient's tolerance, Monitored during session, Premedicated before session, Ice  applied  ? ? ?Home Living   ?  ?  ?  ?  ?  ?  ?  ?  ?  ?   ?  ?Prior Function    ?  ?  ?   ? ?PT Goals (current goals can now be found in the care plan section) Acute Rehab PT Goals ?Patient Stated Goal: Regain IND ?PT Goal Formulation: With patient ?Time For Goal Achievement: 12/06/21 ?Potential to Achieve Goals: Good ?Progress towards PT goals: Progressing toward goals ? ?  ?Frequency ? ? ? 7X/week ? ? ? ?  ?PT Plan Current plan remains appropriate  ? ? ?Co-evaluation   ?  ?  ?  ?  ? ?  ?AM-PAC PT "6 Clicks" Mobility   ?Outcome Measure ? Help needed turning from your back to your side while in a flat bed without using bedrails?: A Little ?Help needed moving from lying on your back to sitting on the side of a flat bed without using bedrails?: A Little ?Help needed moving to and from a bed to a chair (including a wheelchair)?: A Little ?Help needed standing up from a chair using your arms (e.g., wheelchair or bedside chair)?: A Little ?Help needed to walk in hospital room?: A Little ?Help needed climbing 3-5 steps with a railing? : A Little ?6 Click Score: 18 ? ?  ?End of Session Equipment Utilized During Treatment: Gait belt ?Activity Tolerance: Patient tolerated treatment well;Patient limited by pain ?Patient left: with call bell/phone within reach;in chair;with chair alarm set ?Nurse Communication: Mobility status ?PT Visit Diagnosis: Difficulty in walking, not elsewhere classified (R26.2) ?  ? ? ?Time: [id number] ?PT Time Calculation (min) (ACUTE ONLY): 34 min ? ?Charges:  $Gait Training: 8-22 mins ?$Therapeutic Exercise: 8-22 mins          ?          ? ?Debe Coder PT ?Acute Rehabilitation Services ?Pager [phone] ?Office [phone] ? ? ? ?Charles Lara ?11/30/2021, 2:05 PM ? ?

## 2021-11-30 NOTE — Progress Notes (Signed)
Physical Therapy Treatment ?Patient Details ?Name: Charles Lara ?MRN: [id number] ?DOB: Jan 26, 1961 ?Today's Date: 11/30/2021 ? ? ?History of Present Illness Pt s/p R TKR revision and with hx of bil UKR, ADHD, lumbar fusions with 5 back surgeries, L foot drop ? ?  ?PT Comments  ? ? Pt continues cooperative and up to ambulate limited distance in hall before attempting 2 steps with no rails - up bkwd with RW and up fwd with crutches.  Pt managed stairs with min assist but with increased pain - ltd by non-operative LE limitations.  Pt provided with and reviewed written instruction for stairs and for HEP.   ?Recommendations for follow up therapy are one component of a multi-disciplinary discharge planning process, led by the attending physician.  Recommendations may be updated based on patient status, additional functional criteria and insurance authorization. ? ?Follow Up Recommendations ? Home health PT ?  ?  ?Assistance Recommended at Discharge Frequent or constant Supervision/Assistance  ?Patient can return home with the following A lot of help with walking and/or transfers;A little help with bathing/dressing/bathroom;Assistance with cooking/housework;Assist for transportation;Help with stairs or ramp for entrance ?  ?Equipment Recommendations ? None recommended by PT  ?  ?Recommendations for Other Services   ? ? ?  ?Precautions / Restrictions Precautions ?Precautions: Knee;Fall ?Required Braces or Orthoses: Knee Immobilizer - Right;Other Brace ?Knee Immobilizer - Right: Discontinue once straight leg raise with < 10 degree lag (pt performed IND SLR this am) ?Restrictions ?Weight Bearing Restrictions: No ?RLE Weight Bearing: Weight bearing as tolerated ?Other Position/Activity Restrictions: WBAT  ?  ? ?Mobility ? Bed Mobility ?Overal bed mobility: Needs Assistance ?Bed Mobility: Sit to Supine ?  ?  ?Supine to sit: Min guard, Supervision ?Sit to supine: Supervision ?  ?General bed mobility comments: No physical assist ?   ? ?Transfers ?Overall transfer level: Needs assistance ?Equipment used: Rolling walker (2 wheels) ?Transfers: Sit to/from Stand ?Sit to Stand: Min guard, Supervision ?  ?  ?  ?  ?  ?General transfer comment: min cues and heavy use of UEs to stand from low recliner but no physical assist ?  ? ?Ambulation/Gait ?Ambulation/Gait assistance: Min guard, Supervision ?Gait Distance (Feet): 80 Feet ?Assistive device: Rolling walker (2 wheels) ?Gait Pattern/deviations: Step-to pattern, Decreased step length - right, Decreased step length - left, Shuffle, Trunk flexed ?Gait velocity: decr ?  ?  ?General Gait Details: cues for sequence, posture and position from RW ? ? ?Stairs ?Stairs: Yes ?Stairs assistance: Min assist ?Stair Management: No rails, Step to pattern, Forwards, With walker, Backwards, With crutches ?Number of Stairs: 4 ?General stair comments: 2 steps bkwd with RW; 2 steps fwd with crutches;  cues for sequence and foot/RW/crutch placement; written instruction provided ? ? ?Wheelchair Mobility ?  ? ?Modified Rankin (Stroke Patients Only) ?  ? ? ?  ?Balance Overall balance assessment: Needs assistance ?Sitting-balance support: No upper extremity supported, Feet supported ?Sitting balance-Leahy Scale: Good ?  ?  ?Standing balance support: No upper extremity supported ?Standing balance-Leahy Scale: Fair ?  ?  ?  ?  ?  ?  ?  ?  ?  ?  ?  ?  ?  ? ?  ?Cognition Arousal/Alertness: Awake/alert ?Behavior During Therapy: [facility] for tasks assessed/performed ?Overall Cognitive Status: Within Functional Limits for tasks assessed ?  ?  ?  ?  ?  ?  ?  ?  ?  ?  ?  ?  ?  ?  ?  ?  ?  ?  ?  ? ?  ?  Exercises Total Joint Exercises ?Ankle Circles/Pumps: AROM, Both, AAROM, 15 reps, Supine ?Quad Sets: AROM, Both, 10 reps, Supine ?Heel Slides: AAROM, Right, 15 reps, Supine ?Straight Leg Raises: AAROM, Right, 10 reps, Supine ?Long Arc Quad: AAROM, AROM, Right, 10 reps, Seated ? ?  ?General Comments   ?  ?  ? ?Pertinent Vitals/Pain Pain  Assessment ?Pain Assessment: 0-10 ?Pain Score: 6  ?Pain Location: R knee ?Pain Descriptors / Indicators: Aching, Sore ?Pain Intervention(s): Limited activity within patient's tolerance, Monitored during session, Premedicated before session, Ice applied  ? ? ?Home Living   ?  ?  ?  ?  ?  ?  ?  ?  ?  ?   ?  ?Prior Function    ?  ?  ?   ? ?PT Goals (current goals can now be found in the care plan section) Acute Rehab PT Goals ?Patient Stated Goal: Regain IND ?PT Goal Formulation: With patient ?Time For Goal Achievement: 12/06/21 ?Potential to Achieve Goals: Good ?Progress towards PT goals: Progressing toward goals ? ?  ?Frequency ? ? ? 7X/week ? ? ? ?  ?PT Plan Current plan remains appropriate  ? ? ?Co-evaluation   ?  ?  ?  ?  ? ?  ?AM-PAC PT "6 Clicks" Mobility   ?Outcome Measure ? Help needed turning from your back to your side while in a flat bed without using bedrails?: A Little ?Help needed moving from lying on your back to sitting on the side of a flat bed without using bedrails?: A Little ?Help needed moving to and from a bed to a chair (including a wheelchair)?: A Little ?Help needed standing up from a chair using your arms (e.g., wheelchair or bedside chair)?: A Little ?Help needed to walk in hospital room?: A Little ?Help needed climbing 3-5 steps with a railing? : A Little ?6 Click Score: 18 ? ?  ?End of Session Equipment Utilized During Treatment: Gait belt ?Activity Tolerance: Patient tolerated treatment well;Patient limited by pain ?Patient left: with call bell/phone within reach;in chair;with chair alarm set ?Nurse Communication: Mobility status ?PT Visit Diagnosis: Difficulty in walking, not elsewhere classified (R26.2) ?  ? ? ?Time: [id number] ?PT Time Calculation (min) (ACUTE ONLY): 21 min ? ?Charges:  $Gait Training: 8-22 mins ?$Therapeutic Exercise: 8-22 mins          ?          ? ?Mauro Kaufmann PT ?Acute Rehabilitation Services ?Pager [phone] ?Office  [phone] ? ? ? ?Charles Lara ?11/30/2021, 2:12 PM ? ?

## 2021-11-30 NOTE — Progress Notes (Signed)
? ?  Subjective: ?2 Days Post-Op Procedure(s) (LRB): ?TOTAL KNEE REVISION (Right) ?Patient reports pain as mild.   ?Patient seen in rounds by Dr. Lequita Halt. ?Patient is well, and has had no acute complaints or problems. States he is ready to go home. Denies chest pain or SOB. No issues overnight.  ?Plan is to go Home after hospital stay. ? ?Objective: ?Vital signs in last 24 hours: ?Temp:  [97.7 ?F (36.5 ?C)-98.3 ?F (36.8 ?C)] 97.7 ?F (36.5 ?C) (03/31 0531) ?Pulse Rate:  [70-79] 70 (03/31 0531) ?Resp:  [17] 17 (03/31 0531) ?BP: (108-118)/(65-74) 117/71 (03/31 0531) ?SpO2:  [93 %-99 %] 99 % (03/31 0531) ? ?Intake/Output from previous day: ? ?Intake/Output Summary (Last 24 hours) at 11/30/2021 0745 ?Last data filed at 11/30/2021 3903 ?Gross per 24 hour  ?Intake 1945 ml  ?Output 3050 ml  ?Net -1105 ml  ?  ?Intake/Output this shift: ?Total I/O ?In: -  ?Out: 300 [Urine:300] ? ?Labs: ?Recent Labs  ?  11/29/21 ?0332 11/30/21 ?0335  ?HGB 9.8* 9.7*  ? ?Recent Labs  ?  11/29/21 ?0332 11/30/21 ?0335  ?WBC 8.7 9.5  ?RBC 3.33* 3.29*  ?HCT 31.9* 31.9*  ?PLT 254 248  ? ?Recent Labs  ?  11/29/21 ?0332 11/30/21 ?0335  ?NA 136 137  ?K 4.3 4.2  ?CL 106 105  ?CO2 27 28  ?BUN 28* 27*  ?CREATININE 1.07 0.94  ?GLUCOSE 124* 90  ?CALCIUM 8.2* 8.7*  ? ?No results for input(s): LABPT, INR in the last 72 hours. ? ?Exam: ?General - Patient is Alert and Oriented ?Extremity - Neurologically intact ?Neurovascular intact ?Sensation intact distally ?Dorsiflexion/Plantar flexion intact ?Dressing/Incision - clean, dry, no drainage ?Motor Function - intact, moving foot and toes well on exam.  ? ?Past Medical History:  ?Diagnosis Date  ? Anemia   ? Arthritis   ? Attention deficit disorder   ? Diaphoresis   ? Dyspnea   ? GERD (gastroesophageal reflux disease)   ? Heart murmur   ? High blood pressure   ? not on meds in at least 4 months per pt stated MD took him off of meds  ? High cholesterol   ? History of kidney stones   ? ? ?Assessment/Plan: ?2 Days  Post-Op Procedure(s) (LRB): ?TOTAL KNEE REVISION (Right) ?Principal Problem: ?  Failed total knee arthroplasty (HCC) ?Active Problems: ?  Failed total right knee replacement (HCC) ? ?Estimated body mass index is 28.98 kg/m? as calculated from the following: ?  Height as of this encounter: 5\' 9"  (1.753 m). ?  Weight as of this encounter: 89 kg. ?Up with therapy ? ?DVT Prophylaxis - Aspirin ?Weight-bearing as tolerated ? ?Plan for discharge to home once cleared with PT. ?Plan for HHPT, arrangements made yesterday with social work due to issues securing a ride for OP. ?Follow-up in the office in 2 weeks. ? ?The PDMP database was reviewed today prior to any opioid medications being prescribed to this patient. ? ? , PA-C ?Orthopedic Surgery ?([id number]) Arther Abbott ?11/30/2021, 7:45 AM ? ?

## 2021-11-30 NOTE — Plan of Care (Signed)
All discharge instructions were given to Pt. Discussed pain management. All questions were answered. ?

## 2021-11-30 NOTE — Progress Notes (Signed)
Orthopedic Tech Progress Note ?Patient Details:  ?Charles Lara ?12/03/1960 ?[id number] ? ?Ortho Devices ?Type of Ortho Device: Crutches ?Ortho Device/Splint Interventions: Ordered, Adjustment ?  ?Post Interventions ?Patient Tolerated: Unable to use device properly ?Instructions Provided: Adjustment of device, Care of device ? ?Charles Lara ?11/30/2021, 1:36 PM ? ?

## 2021-12-03 NOTE — Discharge Summary (Signed)
Patient ID: ?Charles Lara ?MRN: [id number] ?DOB/AGE: [birth year] 62 y.o. ? ?Admit date: 11/28/2021 ?Discharge date: 11/30/2021 ? ?Admission Diagnoses:  ?Principal Problem: ?  Failed total knee arthroplasty (HCC) ?Active Problems: ?  Failed total right knee replacement (HCC) ? ? ?Discharge Diagnoses:  ?Same ? ?Past Medical History:  ?Diagnosis Date  ? Anemia   ? Arthritis   ? Attention deficit disorder   ? Diaphoresis   ? Dyspnea   ? GERD (gastroesophageal reflux disease)   ? Heart murmur   ? High blood pressure   ? not on meds in at least 4 months per pt stated MD took him off of meds  ? High cholesterol   ? History of kidney stones   ? ? ?Surgeries: Procedure(s): ?TOTAL KNEE REVISION on 11/28/2021 ?  ?Consultants:  ? ?Discharged Condition: Improved ? ?Hospital Course: Charles Lara is an 61 y.o. male who was admitted 11/28/2021 for operative treatment ofFailed total knee arthroplasty (HCC). Patient has severe unremitting pain that affects sleep, daily activities, and work/hobbies. After pre-op clearance the patient was taken to the operating room on 11/28/2021 and underwent  Procedure(s): ?TOTAL KNEE REVISION.   ? ?Patient was given perioperative antibiotics:  ?Anti-infectives (From admission, onward)  ? ? Start     Dose/Rate Route Frequency Ordered Stop  ? 11/28/21 2000  ceFAZolin (ANCEF) IVPB 2g/100 mL premix       ? 2 g ?200 mL/hr over 30 Minutes Intravenous Every 6 hours 11/28/21 1818 11/29/21 0700  ? 11/28/21 1315  ceFAZolin (ANCEF) IVPB 2g/100 mL premix       ? 2 g ?200 mL/hr over 30 Minutes Intravenous On call to O.R. 11/28/21 1302 11/28/21 1357  ? ?  ?  ? ?Patient was given sequential compression devices, early ambulation, and chemoprophylaxis to prevent DVT. ? ?Patient benefited maximally from hospital stay and there were no complications.   ? ?Recent vital signs: No data found.  ? ?Recent laboratory studies: No results for input(s): WBC, HGB, HCT, PLT, NA, K, CL, CO2, BUN, CREATININE, GLUCOSE, INR, CALCIUM in the  last 72 hours. ? ?Invalid input(s): PT, 2 ? ? ?Discharge Medications:   ?Allergies as of 11/30/2021   ?No Known Allergies ?  ? ?  ?Medication List  ?  ? ?STOP taking these medications   ? ?amoxicillin-clavulanate 875-125 MG tablet ?Commonly known as: AUGMENTIN ?  ?diclofenac 75 MG EC tablet ?Commonly known as: VOLTAREN ?  ?montelukast 10 MG tablet ?Commonly known as: Singulair ?  ?promethazine-dextromethorphan 6.25-15 MG/5ML syrup ?Commonly known as: PROMETHAZINE-DM ?  ? ?  ? ?TAKE these medications   ? ?aspirin EC 325 MG tablet ?Take 1 tablet (325 mg total) by mouth 2 (two) times daily for 19 days. Then take one 81 mg aspirin once a day for three weeks. Then discontinue aspirin. ?  ?baclofen 10 MG tablet ?Commonly known as: LIORESAL ?Take 10 mg by mouth 2 (two) times daily. ?  ?buPROPion 300 MG 24 hr tablet ?Commonly known as: WELLBUTRIN XL ?Take 300 mg by mouth daily. ?  ?DULoxetine 60 MG capsule ?Commonly known as: CYMBALTA ?Take 120 mg by mouth in the morning. ?  ?esomeprazole 20 MG capsule ?Commonly known as: NEXIUM ?Take 20 mg by mouth daily at 12 noon. ?  ?ferrous sulfate 325 (65 FE) MG tablet ?Take 325 mg by mouth daily. ?  ?methocarbamol 500 MG tablet ?Commonly known as: ROBAXIN ?Take 1 tablet (500 mg total) by mouth every 6 (six) hours as needed for muscle spasms. ?  ?  oxyCODONE 5 MG immediate release tablet ?Commonly known as: Oxy IR/ROXICODONE ?Take 1-2 tablets (5-10 mg total) by mouth every 6 (six) hours as needed for moderate pain or severe pain. ?  ?pramipexole 0.5 MG tablet ?Commonly known as: MIRAPEX ?Take 0.5 mg by mouth at bedtime. ?  ?pregabalin 100 MG capsule ?Commonly known as: LYRICA ?Take 100 mg by mouth 3 (three) times daily. ?  ?tadalafil 20 MG tablet ?Commonly known as: CIALIS ?Take 20 mg by mouth daily as needed for erectile dysfunction. ?  ?Testosterone Cypionate 200 MG/ML Soln ?Inject 100 mg into the muscle once a week. ?  ?valACYclovir 500 MG tablet ?Commonly known as: VALTREX ?Take 500  mg by mouth 2 (two) times daily as needed. ?  ? ?  ? ?  ?  ? ? ?  ?Discharge Care Instructions  ?(From admission, onward)  ?  ? ? ?  ? ?  Start     Ordered  ? 11/30/21 0000  Weight bearing as tolerated       ? 11/30/21 0749  ? 11/30/21 0000  Change dressing       ?Comments: You may remove the bulky bandage (ACE wrap and gauze) two days after surgery. You will have an adhesive waterproof bandage underneath. Leave this in place until your first follow-up appointment.  ? 11/30/21 0749  ? ?  ?  ? ?  ? ? ?Diagnostic Studies: No results found. ? ?Disposition: Discharge disposition: 01-Home or Self Care ? ? ? ? ? ? ?Discharge Instructions   ? ? Call MD / Call 911   Complete by: As directed ?  ? If you experience chest pain or shortness of breath, CALL 911 and be transported to the hospital emergency room.  If you develope a fever above 101 F, pus (white drainage) or increased drainage or redness at the wound, or calf pain, call your surgeon's office.  ? Change dressing   Complete by: As directed ?  ? You may remove the bulky bandage (ACE wrap and gauze) two days after surgery. You will have an adhesive waterproof bandage underneath. Leave this in place until your first follow-up appointment.  ? Constipation Prevention   Complete by: As directed ?  ? Drink plenty of fluids.  Prune juice may be helpful.  You may use a stool softener, such as Colace (over the counter) 100 mg twice a day.  Use MiraLax (over the counter) for constipation as needed.  ? Diet - low sodium heart healthy   Complete by: As directed ?  ? Do not put a pillow under the knee. Place it under the heel.   Complete by: As directed ?  ? Driving restrictions   Complete by: As directed ?  ? No driving for two weeks  ? Post-operative opioid taper instructions:   Complete by: As directed ?  ? POST-OPERATIVE OPIOID TAPER INSTRUCTIONS: ?It is important to wean off of your opioid medication as soon as possible. If you do not need pain medication after your surgery  it is ok to stop day one. ?Opioids include: ?Codeine, Hydrocodone(Norco, Vicodin), Oxycodone(Percocet, oxycontin) and hydromorphone amongst others.  ?Long term and even short term use of opiods can cause: ?Increased pain response ?Dependence ?Constipation ?Depression ?Respiratory depression ?And more.  ?Withdrawal symptoms can include ?Flu like symptoms ?Nausea, vomiting ?And more ?Techniques to manage these symptoms ?Hydrate well ?Eat regular healthy meals ?Stay active ?Use relaxation techniques(deep breathing, meditating, yoga) ?Do Not substitute Alcohol to help with tapering ?If  you have been on opioids for less than two weeks and do not have pain than it is ok to stop all together.  ?Plan to wean off of opioids ?This plan should start within one week post op of your joint replacement. ?Maintain the same interval or time between taking each dose and first decrease the dose.  ?Cut the total daily intake of opioids by one tablet each day ?Next start to increase the time between doses. ?The last dose that should be eliminated is the evening dose.  ? ?  ? TED hose   Complete by: As directed ?  ? Use stockings (TED hose) for three weeks on both leg(s).  You may remove them at night for sleeping.  ? Weight bearing as tolerated   Complete by: As directed ?  ? ?  ? ? ? Follow-up Information   ? ? Ollen GrossAluisio, Frank, MD. Schedule an appointment as soon as possible for a visit in 2 week(s).   ?Specialty: Orthopedic Surgery ?Contact information: ?[address] ?[address] ?[place] [address] ?[phone] ? ? ?  ?  ? ? Dorann OuWinston, [facility] Follow up.   ?Specialty: Home Health Services ?Why: to provide home physical therapy services ?Contact information: ?[address] ?[address] ?[place] [id number] ?[phone] ? ? ?  ?  ? ?  ?  ? ?  ? ? ? ?Signed: ?Arther AbbottKristie Sidrah Harden ?12/03/2021, 7:39 AM ? ? ? ?

## 2021-12-04 DIAGNOSIS — G894 Chronic pain syndrome: Secondary | ICD-10-CM | POA: Diagnosis not present

## 2021-12-04 DIAGNOSIS — D649 Anemia, unspecified: Secondary | ICD-10-CM | POA: Diagnosis not present

## 2021-12-04 DIAGNOSIS — I10 Essential (primary) hypertension: Secondary | ICD-10-CM | POA: Diagnosis not present

## 2021-12-04 DIAGNOSIS — T84032D Mechanical loosening of internal right knee prosthetic joint, subsequent encounter: Secondary | ICD-10-CM | POA: Diagnosis not present

## 2021-12-04 DIAGNOSIS — F32A Depression, unspecified: Secondary | ICD-10-CM | POA: Diagnosis not present

## 2021-12-05 ENCOUNTER — Other Ambulatory Visit (HOSPITAL_COMMUNITY): Payer: Self-pay

## 2021-12-06 DIAGNOSIS — F32A Depression, unspecified: Secondary | ICD-10-CM | POA: Diagnosis not present

## 2021-12-06 DIAGNOSIS — T84032D Mechanical loosening of internal right knee prosthetic joint, subsequent encounter: Secondary | ICD-10-CM | POA: Diagnosis not present

## 2021-12-06 DIAGNOSIS — I10 Essential (primary) hypertension: Secondary | ICD-10-CM | POA: Diagnosis not present

## 2021-12-06 DIAGNOSIS — D649 Anemia, unspecified: Secondary | ICD-10-CM | POA: Diagnosis not present

## 2021-12-06 DIAGNOSIS — G894 Chronic pain syndrome: Secondary | ICD-10-CM | POA: Diagnosis not present

## 2021-12-07 ENCOUNTER — Other Ambulatory Visit (HOSPITAL_COMMUNITY): Payer: Self-pay

## 2021-12-07 MED ORDER — OXYCODONE HCL 5 MG PO TABS: ORAL_TABLET | ORAL | 0 refills | Status: AC

## 2021-12-17 DIAGNOSIS — F32A Depression, unspecified: Secondary | ICD-10-CM | POA: Diagnosis not present

## 2021-12-17 DIAGNOSIS — I10 Essential (primary) hypertension: Secondary | ICD-10-CM | POA: Diagnosis not present

## 2021-12-17 DIAGNOSIS — D649 Anemia, unspecified: Secondary | ICD-10-CM | POA: Diagnosis not present

## 2021-12-17 DIAGNOSIS — G894 Chronic pain syndrome: Secondary | ICD-10-CM | POA: Diagnosis not present

## 2021-12-17 DIAGNOSIS — T84032D Mechanical loosening of internal right knee prosthetic joint, subsequent encounter: Secondary | ICD-10-CM | POA: Diagnosis not present

## 2021-12-18 DIAGNOSIS — Z5189 Encounter for other specified aftercare: Secondary | ICD-10-CM | POA: Diagnosis not present

## 2021-12-18 DIAGNOSIS — Z471 Aftercare following joint replacement surgery: Secondary | ICD-10-CM | POA: Diagnosis not present

## 2021-12-28 DIAGNOSIS — N200 Calculus of kidney: Secondary | ICD-10-CM | POA: Diagnosis not present

## 2022-01-01 DIAGNOSIS — Z5189 Encounter for other specified aftercare: Secondary | ICD-10-CM | POA: Diagnosis not present

## 2022-03-07 DIAGNOSIS — M960 Pseudarthrosis after fusion or arthrodesis: Secondary | ICD-10-CM | POA: Diagnosis not present

## 2022-03-07 DIAGNOSIS — M545 Low back pain, unspecified: Secondary | ICD-10-CM | POA: Diagnosis not present

## 2022-03-07 DIAGNOSIS — Z981 Arthrodesis status: Secondary | ICD-10-CM | POA: Diagnosis not present

## 2022-03-07 DIAGNOSIS — T84498A Other mechanical complication of other internal orthopedic devices, implants and grafts, initial encounter: Secondary | ICD-10-CM | POA: Diagnosis not present

## 2022-03-19 DIAGNOSIS — Z981 Arthrodesis status: Secondary | ICD-10-CM | POA: Diagnosis not present

## 2022-03-19 DIAGNOSIS — M47894 Other spondylosis, thoracic region: Secondary | ICD-10-CM | POA: Diagnosis not present

## 2022-03-19 DIAGNOSIS — T84216A Breakdown (mechanical) of internal fixation device of vertebrae, initial encounter: Secondary | ICD-10-CM | POA: Diagnosis not present

## 2022-03-20 DIAGNOSIS — Z720 Tobacco use: Secondary | ICD-10-CM | POA: Diagnosis not present

## 2022-03-20 DIAGNOSIS — K219 Gastro-esophageal reflux disease without esophagitis: Secondary | ICD-10-CM | POA: Diagnosis not present

## 2022-03-20 DIAGNOSIS — I10 Essential (primary) hypertension: Secondary | ICD-10-CM | POA: Diagnosis not present

## 2022-03-20 DIAGNOSIS — I517 Cardiomegaly: Secondary | ICD-10-CM | POA: Diagnosis not present

## 2022-03-20 DIAGNOSIS — Z01818 Encounter for other preprocedural examination: Secondary | ICD-10-CM | POA: Diagnosis not present

## 2022-03-20 DIAGNOSIS — T84226A Displacement of internal fixation device of vertebrae, initial encounter: Secondary | ICD-10-CM | POA: Diagnosis not present

## 2022-03-20 DIAGNOSIS — R011 Cardiac murmur, unspecified: Secondary | ICD-10-CM | POA: Diagnosis not present

## 2022-03-20 DIAGNOSIS — M5134 Other intervertebral disc degeneration, thoracic region: Secondary | ICD-10-CM | POA: Diagnosis not present

## 2022-03-20 DIAGNOSIS — F32A Depression, unspecified: Secondary | ICD-10-CM | POA: Diagnosis not present

## 2022-03-20 DIAGNOSIS — D649 Anemia, unspecified: Secondary | ICD-10-CM | POA: Diagnosis not present

## 2022-03-20 DIAGNOSIS — M438X5 Other specified deforming dorsopathies, thoracolumbar region: Secondary | ICD-10-CM | POA: Diagnosis not present

## 2022-03-20 DIAGNOSIS — M960 Pseudarthrosis after fusion or arthrodesis: Secondary | ICD-10-CM | POA: Diagnosis not present

## 2022-03-20 DIAGNOSIS — T84498A Other mechanical complication of other internal orthopedic devices, implants and grafts, initial encounter: Secondary | ICD-10-CM | POA: Diagnosis not present

## 2022-03-20 DIAGNOSIS — Z96653 Presence of artificial knee joint, bilateral: Secondary | ICD-10-CM | POA: Diagnosis not present

## 2022-03-20 DIAGNOSIS — T84216A Breakdown (mechanical) of internal fixation device of vertebrae, initial encounter: Secondary | ICD-10-CM | POA: Diagnosis not present

## 2022-03-20 DIAGNOSIS — J449 Chronic obstructive pulmonary disease, unspecified: Secondary | ICD-10-CM | POA: Diagnosis not present

## 2022-03-20 DIAGNOSIS — F17210 Nicotine dependence, cigarettes, uncomplicated: Secondary | ICD-10-CM | POA: Diagnosis not present

## 2022-03-28 DIAGNOSIS — Z981 Arthrodesis status: Secondary | ICD-10-CM | POA: Diagnosis not present

## 2022-03-28 DIAGNOSIS — Z96653 Presence of artificial knee joint, bilateral: Secondary | ICD-10-CM | POA: Diagnosis not present

## 2022-03-28 DIAGNOSIS — F32A Depression, unspecified: Secondary | ICD-10-CM | POA: Diagnosis not present

## 2022-03-28 DIAGNOSIS — F17210 Nicotine dependence, cigarettes, uncomplicated: Secondary | ICD-10-CM | POA: Diagnosis not present

## 2022-03-28 DIAGNOSIS — J449 Chronic obstructive pulmonary disease, unspecified: Secondary | ICD-10-CM | POA: Diagnosis not present

## 2022-03-28 DIAGNOSIS — T84498A Other mechanical complication of other internal orthopedic devices, implants and grafts, initial encounter: Secondary | ICD-10-CM | POA: Diagnosis not present

## 2022-03-28 DIAGNOSIS — M960 Pseudarthrosis after fusion or arthrodesis: Secondary | ICD-10-CM | POA: Diagnosis not present

## 2022-03-28 DIAGNOSIS — M40295 Other kyphosis, thoracolumbar region: Secondary | ICD-10-CM | POA: Diagnosis not present

## 2022-03-28 DIAGNOSIS — K219 Gastro-esophageal reflux disease without esophagitis: Secondary | ICD-10-CM | POA: Diagnosis not present

## 2022-03-28 DIAGNOSIS — R011 Cardiac murmur, unspecified: Secondary | ICD-10-CM | POA: Diagnosis not present

## 2022-03-28 DIAGNOSIS — M5415 Radiculopathy, thoracolumbar region: Secondary | ICD-10-CM | POA: Diagnosis not present

## 2022-03-28 DIAGNOSIS — I10 Essential (primary) hypertension: Secondary | ICD-10-CM | POA: Diagnosis not present

## 2022-03-28 DIAGNOSIS — S22088D Other fracture of T11-T12 vertebra, subsequent encounter for fracture with routine healing: Secondary | ICD-10-CM | POA: Diagnosis not present

## 2022-03-28 DIAGNOSIS — T84216A Breakdown (mechanical) of internal fixation device of vertebrae, initial encounter: Secondary | ICD-10-CM | POA: Diagnosis not present

## 2022-03-28 DIAGNOSIS — M532X5 Spinal instabilities, thoracolumbar region: Secondary | ICD-10-CM | POA: Diagnosis not present

## 2022-04-01 ENCOUNTER — Other Ambulatory Visit: Payer: Self-pay | Admitting: *Deleted

## 2022-04-01 DIAGNOSIS — Z981 Arthrodesis status: Secondary | ICD-10-CM

## 2022-04-01 NOTE — Patient Outreach (Signed)
  Care Coordination [facility] Note Transition Care Management Follow-up Telephone Call Date of discharge and from where: [phone] [facility] How have you been since you were released from the hospital? ok Any questions or concerns? No  Items Reviewed: Did the pt receive and understand the discharge instructions provided? Yes  Medications obtained and verified? Yes  Other? No  Any new allergies since your discharge? N  Dietary orders reviewed? No Do you have support at home? Yes   Home Care and Equipment/Supplies: Were home health services ordered? no If so, what is the name of the agency? N   Has the agency set up a time to come to the patient's home? no Were any new equipment or medical supplies ordered?  No What is the name of the medical supply agency? N  Were you able to get the supplies/equipment? no Do you have any questions related to the use of the equipment or supplies? No  Functional Questionnaire: (I = Independent and D = Dependent) ADLs: I  Bathing/Dressing- I  Meal Prep- D  Eating- I  Maintaining continence- I  Transferring/Ambulation- I  Managing Meds- I  Follow up appointments reviewed:  PCP Hospital f/u appt confirmed? No   Specialist Hospital f/u appt confirmed? Yes  Scheduled to see Lowry Ram [facility] 08102:00/ Carloyn Manner NP [facility] 08/242/[id number]  transportation arrangements needed? N If their condition worsens, is the pt aware to call PCP or go to the Emergency Dept.? Yes Was the patient provided with contact information for the PCP's office or ED? Y Was to pt encouraged to call back with questions or concerns? Yes  SDOH assessments and interventions completed:   Yes  Care Coordination Interventions Activated:  Yes Care Coordination Interventions:  Y [employer] meal plan  Encounter Outcome:  Pt. Visit Completed

## 2022-04-01 NOTE — Patient Outreach (Signed)
  Care Coordination [facility] Note Transition Care Management Unsuccessful Follow-up Telephone Call  Date of discharge and from where:  [facility] [phone]  Attempts:  1st Attempt  Reason for unsuccessful TCM follow-up call:  Left voice message  Gean Maidens BSN RN [facility] Care Management [phone]

## 2022-04-02 ENCOUNTER — Telehealth: Payer: Self-pay

## 2022-04-02 NOTE — Telephone Encounter (Signed)
   Telephone encounter was:  Unsuccessful.  04/02/2022 Name: Charles Lara MRN: [id number] DOB: 09/20/1960  Unsuccessful outbound call made today to assist with:   moms meals  Outreach Attempt:  1st Attempt  A HIPAA compliant voice message was left requesting a return call.  Instructed patient to call back at earliest convenience.   Lenard Forth Care Guide, Embedded Care Coordination [facility], Care Management  [phone] [address] [place], [place], [place] [address] Phone: [phone] Email: [email or web address]

## 2022-04-03 ENCOUNTER — Telehealth: Payer: Self-pay

## 2022-04-03 NOTE — Telephone Encounter (Signed)
   Telephone encounter was:  Successful.  04/03/2022 Name: Charles Lara MRN: [id number] DOB: Mar 30, 1961  Charles Lara is a 61 y.o. year old male who is a primary care patient of Thea Alken, MD . The community resource team was consulted for assistance with Food Insecurity  Care guide performed the following interventions: Patient provided with information about care guide support team and interviewed to confirm resource needs. Patient asked if I could call back another time  Follow Up Plan:  Care guide will follow up with patient by phone over the next day    [facility] Guide, Embedded Care Coordination [facility], Care Management  [phone] [address] [place], [place], [place] [address] Phone: [phone] Email: [email or web address]

## 2022-04-08 ENCOUNTER — Telehealth: Payer: Self-pay

## 2022-04-08 NOTE — Telephone Encounter (Signed)
   Telephone encounter was:  Unsuccessful.  04/08/2022 Name: Charles Lara MRN: [id number] DOB: Dec 11, 1960  Unsuccessful outbound call made today to assist with:   MOMS MEALS  Outreach Attempt:  3rd Attempt.  Referral closed unable to contact patient.  A HIPAA compliant voice message was left requesting a return call.  Instructed patient to call back at  earliest convenience.   Lenard Forth Care Guide, Embedded Care Coordination [facility], Care Management  [phone] [address] [place], [place], [place] [address] Phone: [phone] Email: [email or web address]

## 2022-04-10 DIAGNOSIS — R600 Localized edema: Secondary | ICD-10-CM | POA: Diagnosis not present

## 2022-04-10 DIAGNOSIS — M25562 Pain in left knee: Secondary | ICD-10-CM | POA: Diagnosis not present

## 2022-04-10 DIAGNOSIS — D649 Anemia, unspecified: Secondary | ICD-10-CM | POA: Diagnosis not present

## 2022-04-10 DIAGNOSIS — M19012 Primary osteoarthritis, left shoulder: Secondary | ICD-10-CM | POA: Diagnosis not present

## 2022-04-10 DIAGNOSIS — M25561 Pain in right knee: Secondary | ICD-10-CM | POA: Diagnosis not present

## 2022-04-10 DIAGNOSIS — D509 Iron deficiency anemia, unspecified: Secondary | ICD-10-CM | POA: Diagnosis not present

## 2022-04-10 DIAGNOSIS — G8929 Other chronic pain: Secondary | ICD-10-CM | POA: Diagnosis not present

## 2022-04-10 DIAGNOSIS — M25512 Pain in left shoulder: Secondary | ICD-10-CM | POA: Diagnosis not present

## 2022-04-11 DIAGNOSIS — Z4789 Encounter for other orthopedic aftercare: Secondary | ICD-10-CM | POA: Diagnosis not present

## 2022-04-11 DIAGNOSIS — Z981 Arthrodesis status: Secondary | ICD-10-CM | POA: Diagnosis not present

## 2022-04-11 DIAGNOSIS — Z4802 Encounter for removal of sutures: Secondary | ICD-10-CM | POA: Diagnosis not present

## 2022-04-25 DIAGNOSIS — Z4789 Encounter for other orthopedic aftercare: Secondary | ICD-10-CM | POA: Diagnosis not present

## 2022-04-25 DIAGNOSIS — Z981 Arthrodesis status: Secondary | ICD-10-CM | POA: Diagnosis not present

## 2022-04-25 DIAGNOSIS — M5134 Other intervertebral disc degeneration, thoracic region: Secondary | ICD-10-CM | POA: Diagnosis not present

## 2022-04-25 DIAGNOSIS — M4316 Spondylolisthesis, lumbar region: Secondary | ICD-10-CM | POA: Diagnosis not present

## 2022-04-25 DIAGNOSIS — M4312 Spondylolisthesis, cervical region: Secondary | ICD-10-CM | POA: Diagnosis not present

## 2022-04-25 DIAGNOSIS — M5136 Other intervertebral disc degeneration, lumbar region: Secondary | ICD-10-CM | POA: Diagnosis not present

## 2022-04-25 DIAGNOSIS — M5030 Other cervical disc degeneration, unspecified cervical region: Secondary | ICD-10-CM | POA: Diagnosis not present

## 2022-06-05 DIAGNOSIS — G9529 Other cord compression: Secondary | ICD-10-CM | POA: Diagnosis not present

## 2022-06-05 DIAGNOSIS — R200 Anesthesia of skin: Secondary | ICD-10-CM | POA: Diagnosis not present

## 2022-06-05 DIAGNOSIS — M4804 Spinal stenosis, thoracic region: Secondary | ICD-10-CM | POA: Diagnosis not present

## 2022-06-05 DIAGNOSIS — Z87442 Personal history of urinary calculi: Secondary | ICD-10-CM | POA: Diagnosis not present

## 2022-06-05 DIAGNOSIS — M5104 Intervertebral disc disorders with myelopathy, thoracic region: Secondary | ICD-10-CM | POA: Diagnosis not present

## 2022-06-05 DIAGNOSIS — M4802 Spinal stenosis, cervical region: Secondary | ICD-10-CM | POA: Diagnosis not present

## 2022-06-05 DIAGNOSIS — Z981 Arthrodesis status: Secondary | ICD-10-CM | POA: Diagnosis not present

## 2022-06-05 DIAGNOSIS — M4645 Discitis, unspecified, thoracolumbar region: Secondary | ICD-10-CM | POA: Diagnosis not present

## 2022-06-05 DIAGNOSIS — F17210 Nicotine dependence, cigarettes, uncomplicated: Secondary | ICD-10-CM | POA: Diagnosis not present

## 2022-06-05 DIAGNOSIS — M4314 Spondylolisthesis, thoracic region: Secondary | ICD-10-CM | POA: Diagnosis not present

## 2022-06-05 DIAGNOSIS — M5124 Other intervertebral disc displacement, thoracic region: Secondary | ICD-10-CM | POA: Diagnosis not present

## 2022-06-05 DIAGNOSIS — R202 Paresthesia of skin: Secondary | ICD-10-CM | POA: Diagnosis not present

## 2022-06-05 DIAGNOSIS — Z472 Encounter for removal of internal fixation device: Secondary | ICD-10-CM | POA: Diagnosis not present

## 2022-06-05 DIAGNOSIS — Z96653 Presence of artificial knee joint, bilateral: Secondary | ICD-10-CM | POA: Diagnosis not present

## 2022-06-05 DIAGNOSIS — N189 Chronic kidney disease, unspecified: Secondary | ICD-10-CM | POA: Diagnosis not present

## 2022-06-05 DIAGNOSIS — G9519 Other vascular myelopathies: Secondary | ICD-10-CM | POA: Diagnosis not present

## 2022-06-05 DIAGNOSIS — I129 Hypertensive chronic kidney disease with stage 1 through stage 4 chronic kidney disease, or unspecified chronic kidney disease: Secondary | ICD-10-CM | POA: Diagnosis not present

## 2022-06-05 DIAGNOSIS — M47812 Spondylosis without myelopathy or radiculopathy, cervical region: Secondary | ICD-10-CM | POA: Diagnosis not present

## 2022-06-20 ENCOUNTER — Encounter (HOSPITAL_COMMUNITY): Admission: RE | Admit: 2022-06-20 | Payer: Medicare Other | Source: Ambulatory Visit

## 2022-06-25 DIAGNOSIS — Z981 Arthrodesis status: Secondary | ICD-10-CM | POA: Diagnosis not present

## 2022-06-25 DIAGNOSIS — Z4789 Encounter for other orthopedic aftercare: Secondary | ICD-10-CM | POA: Diagnosis not present

## 2022-06-25 DIAGNOSIS — Z9889 Other specified postprocedural states: Secondary | ICD-10-CM | POA: Diagnosis not present

## 2022-06-25 DIAGNOSIS — Z791 Long term (current) use of non-steroidal anti-inflammatories (NSAID): Secondary | ICD-10-CM | POA: Diagnosis not present

## 2022-07-03 ENCOUNTER — Inpatient Hospital Stay: Admit: 2022-07-03 | Payer: Medicare Other | Admitting: Orthopedic Surgery

## 2022-07-03 SURGERY — CONVERSION, ARTHROPLASTY, KNEE, PARTIAL, TO TOTAL KNEE ARTHROPLASTY
Anesthesia: Choice | Site: Knee | Laterality: Left

## 2022-07-10 DIAGNOSIS — M40204 Unspecified kyphosis, thoracic region: Secondary | ICD-10-CM | POA: Diagnosis not present

## 2022-07-10 DIAGNOSIS — Z9889 Other specified postprocedural states: Secondary | ICD-10-CM | POA: Diagnosis not present

## 2022-07-10 DIAGNOSIS — M5137 Other intervertebral disc degeneration, lumbosacral region: Secondary | ICD-10-CM | POA: Diagnosis not present

## 2022-07-10 DIAGNOSIS — Z981 Arthrodesis status: Secondary | ICD-10-CM | POA: Diagnosis not present

## 2022-07-17 DIAGNOSIS — L739 Follicular disorder, unspecified: Secondary | ICD-10-CM | POA: Diagnosis not present

## 2022-07-17 DIAGNOSIS — M25512 Pain in left shoulder: Secondary | ICD-10-CM | POA: Diagnosis not present

## 2022-07-17 DIAGNOSIS — M5116 Intervertebral disc disorders with radiculopathy, lumbar region: Secondary | ICD-10-CM | POA: Diagnosis not present

## 2022-07-17 DIAGNOSIS — M25562 Pain in left knee: Secondary | ICD-10-CM | POA: Diagnosis not present

## 2022-09-06 ENCOUNTER — Other Ambulatory Visit (HOSPITAL_COMMUNITY): Payer: Self-pay

## 2022-09-26 DIAGNOSIS — G894 Chronic pain syndrome: Secondary | ICD-10-CM | POA: Diagnosis not present

## 2022-09-26 DIAGNOSIS — Z981 Arthrodesis status: Secondary | ICD-10-CM | POA: Diagnosis not present

## 2022-09-26 DIAGNOSIS — Z96651 Presence of right artificial knee joint: Secondary | ICD-10-CM | POA: Diagnosis not present

## 2022-09-26 DIAGNOSIS — M1712 Unilateral primary osteoarthritis, left knee: Secondary | ICD-10-CM | POA: Diagnosis not present

## 2022-10-03 DIAGNOSIS — R5383 Other fatigue: Secondary | ICD-10-CM | POA: Diagnosis not present

## 2022-10-03 DIAGNOSIS — M129 Arthropathy, unspecified: Secondary | ICD-10-CM | POA: Diagnosis not present

## 2022-10-03 DIAGNOSIS — R030 Elevated blood-pressure reading, without diagnosis of hypertension: Secondary | ICD-10-CM | POA: Diagnosis not present

## 2022-10-03 DIAGNOSIS — E559 Vitamin D deficiency, unspecified: Secondary | ICD-10-CM | POA: Diagnosis not present

## 2022-10-03 DIAGNOSIS — M25569 Pain in unspecified knee: Secondary | ICD-10-CM | POA: Diagnosis not present

## 2022-10-03 DIAGNOSIS — Z79899 Other long term (current) drug therapy: Secondary | ICD-10-CM | POA: Diagnosis not present

## 2022-10-03 DIAGNOSIS — M25519 Pain in unspecified shoulder: Secondary | ICD-10-CM | POA: Diagnosis not present

## 2022-10-03 DIAGNOSIS — M546 Pain in thoracic spine: Secondary | ICD-10-CM | POA: Diagnosis not present

## 2022-10-07 DIAGNOSIS — Z79899 Other long term (current) drug therapy: Secondary | ICD-10-CM | POA: Diagnosis not present

## 2022-10-11 DIAGNOSIS — M25569 Pain in unspecified knee: Secondary | ICD-10-CM | POA: Diagnosis not present

## 2022-10-11 DIAGNOSIS — Z79899 Other long term (current) drug therapy: Secondary | ICD-10-CM | POA: Diagnosis not present

## 2022-10-11 DIAGNOSIS — M25519 Pain in unspecified shoulder: Secondary | ICD-10-CM | POA: Diagnosis not present

## 2022-10-11 DIAGNOSIS — R030 Elevated blood-pressure reading, without diagnosis of hypertension: Secondary | ICD-10-CM | POA: Diagnosis not present

## 2022-10-15 NOTE — H&P (Cosign Needed Addendum)
UKA TO TKA REVISION ADMISSION H&P  Patient is being admitted for failed left partial knee arthroplasty.  Subjective:  Chief Complaint: Left knee pain.  HPI: Charles Lara, 62 y.o. male has a history of pain and functional disability in the left knee due to  failed unicompartmental knee arthroplasty  and has failed non-surgical conservative treatments for greater than 12 weeks to include use of assistive devices and activity modification. Onset of symptoms was gradual, starting 5 years ago with gradually worsening course since that time.  Patient currently rates pain in the left knee at 5 out of 10 with activity. Patient has worsening of pain with activity and weight bearing and pain that interferes with activities of daily living. Patient has evidence of  poor UKA alignment  by imaging studies. There is no active infection.  Patient Active Problem List   Diagnosis Date Noted   Failed total knee arthroplasty ([place]) 11/28/2021   Failed total right knee replacement ([place]) 11/28/2021   Generalized hyperhidrosis-seen by Endo in past 07/14/2018   ED (erectile dysfunction) 07/14/2018   Testosterone deficiency in male 07/14/2018   Attention deficit disorder 07/14/2018   h/o High blood pressure 07/14/2018   Kidney stones-has urologist 07/14/2018   Depression 07/14/2018   Tobacco abuse counseling 07/14/2018   Tobacco use disorder-  proximally 45-pack-year history, current smoker 07/14/2018   Chronic pain 11/22/2013   GERD (gastroesophageal reflux disease) 11/22/2013   Hyperlipidemia 11/22/2013   Chronic pain disorder 02/21/2012   Postlaminectomy syndrome, lumbar region 02/21/2012   Other chronic postoperative pain 02/21/2012    Past Medical History:  Diagnosis Date   Anemia    Arthritis    Attention deficit disorder    Diaphoresis    Dyspnea    GERD (gastroesophageal reflux disease)    Heart murmur    High blood pressure    not on meds in at least 4 months per pt stated MD took him off of  meds   High cholesterol    History of kidney stones     Past Surgical History:  Procedure Laterality Date   BACK SURGERY     total of 5 back surgeries   BILATERAL CARPAL TUNNEL RELEASE     HERNIA REPAIR     KIDNEY STONE SURGERY     KIDNEY STONE SURGERY     x 2   KNEE SURGERY     LUMBAR FUSION     MEDIAL PARTIAL KNEE REPLACEMENT     REPLACEMENT TOTAL KNEE     SPINE SURGERY     TOTAL KNEE REVISION Right 11/28/2021   Procedure: TOTAL KNEE REVISION;  Surgeon: Gaynelle Arabian, MD;  Location: [facility];  Service: Orthopedics;  Laterality: Right;    Prior to Admission medications   Medication Sig Start Date End Date Taking? Authorizing Provider  baclofen (LIORESAL) 10 MG tablet Take 10 mg by mouth 2 (two) times daily. 07/13/18   [provider]  buPROPion (WELLBUTRIN XL) 300 MG 24 hr tablet Take 300 mg by mouth daily.    [provider]  DULoxetine (CYMBALTA) 60 MG capsule Take 120 mg by mouth in the morning. 07/13/18   [provider]  esomeprazole (NEXIUM) 20 MG capsule Take 20 mg by mouth daily at 12 noon.    [provider]  ferrous sulfate 325 (65 FE) MG tablet Take 325 mg by mouth daily.    [provider]  methocarbamol (ROBAXIN) 500 MG tablet Take 1 tablet (500 mg total) by mouth every 6 (  six) hours as needed for muscle spasms. 11/30/21   Edmisten, Kristie L, PA  oxyCODONE (OXY IR/ROXICODONE) 5 MG immediate release tablet Take 1-2 tablets (5-10 mg total) by mouth every 6 (six) hours as needed for moderate pain or severe pain. 12/06/21     pramipexole (MIRAPEX) 0.5 MG tablet Take 0.5 mg by mouth at bedtime. 07/13/18   [provider]  pregabalin (LYRICA) 100 MG capsule Take 100 mg by mouth 3 (three) times daily.    [provider]  tadalafil (CIALIS) 20 MG tablet Take 20 mg by mouth daily as needed for erectile dysfunction.    [provider]  Testosterone Cypionate 200 MG/ML SOLN Inject 100 mg into the muscle once a  week.    [provider]  valACYclovir (VALTREX) 500 MG tablet Take 500 mg by mouth 2 (two) times daily as needed. 07/30/21   [provider]    No Known Allergies  Social History   Socioeconomic History   Marital status: Divorced    Spouse name: Not on file   Number of children: Not on file   Years of education: Not on file   Highest education level: Not on file  Occupational History   Not on file  Tobacco Use   Smoking status: Every Day    Packs/day: 1.00    Years: 45.00    Total pack years: 45.00    Types: Cigarettes   Smokeless tobacco: Never  Vaping Use   Vaping Use: Never used  Substance and Sexual Activity   Alcohol use: Yes    Alcohol/week: 12.0 standard drinks of alcohol    Types: 12 Standard drinks or equivalent per week    Comment: drinks 1-2 x per week   Drug use: Not Currently    Types: Cocaine    Comment: none since 19990s   Sexual activity: Yes    Partners: Female    Birth control/protection: None  Other Topics Concern   Not on file  Social History Narrative   Not on file   Social Determinants of Health   Financial Resource Strain: Not on file  Food Insecurity: Not on file  Transportation Needs: Not on file  Physical Activity: Not on file  Stress: Not on file  Social Connections: Not on file  Intimate Partner Violence: Not on file    Tobacco Use: High Risk (11/30/2021)   Patient History    Smoking Tobacco Use: Every Day    Smokeless Tobacco Use: Never    Passive Exposure: Not on file   Social History   Substance and Sexual Activity  Alcohol Use Yes   Alcohol/week: 12.0 standard drinks of alcohol   Types: 12 Standard drinks or equivalent per week   Comment: drinks 1-2 x per week    No family history on file.  ROS  Objective:  Physical Exam: The patient is a pleasant, well-developed male alert and oriented in no apparent distress.    Right knee exam:  Looks great.  There is subcutaneous swelling.  The patient  has well-healed incisions, dry, no drainage and without tenderness.  The range of motion: 0 to 130 degrees.  The knee is stable and nontender.   Left Knee Exam:  Slight varus deformity.  No effusion present. No swelling present.  The range of motion is: 5 to 120 degrees.  Marked crepitus on range of motion of the knee.  Positive medial and lateral joint line tenderness.  The knee is stable.    The patient's  sensation and motor function are intact in their lower extremities. Their distal pulses are 2+. The bilateral calves are soft and nontender.   Radiographs- AP and lateral of the left knee dated 10/2021 demonstrate a unicompartmental replacement medially, which looks loose. He has severe tricompartmental bone-on-bone changes, patellofemoral and lateral.  Assessment/Plan:  End stage arthritis, left knee   The patient history, physical examination, clinical judgment of the provider and imaging studies are consistent with end stage degenerative joint disease of the left knee and total knee arthroplasty is deemed medically necessary. The treatment options including medical management, injection therapy arthroscopy and arthroplasty were discussed at length. The risks and benefits of total knee arthroplasty were presented and reviewed. The risks due to aseptic loosening, infection, stiffness, patella tracking problems, thromboembolic complications and other imponderables were discussed. The patient acknowledged the explanation, agreed to proceed with the plan and consent was signed. Patient is being admitted for inpatient treatment for surgery, pain control, PT, OT, prophylactic antibiotics, VTE prophylaxis, progressive ambulation and ADLs and discharge planning. The patient is planning to be discharged home with friend and OPPT scheduled.  Anticipated LOS equal to or greater than 2 midnights due to  - Unanticipated findings during/Post Surgery: Slow post-op progression: GI, pain control,  mobility  - Patient is a high risk of re-admission due to: None  Therapy Plans: EO Disposition: Home alone, will ask friend to stay with him Planned DVT Prophylaxis: Aspirin 368m  DME Needed: None PCP: TKnox Royalty MD (clearance received) TXA: IV Allergies: NKDA Anesthesia Concerns: None BMI: 30.9 Last HgbA1c: 5.3 (08/2022) [facility] Other: -Taking 164moxycodone 4x day  - Patient was instructed on what medications to stop prior to surgery. - Follow-up visit in 2 weeks with Dr. AlWynelle Link Begin physical therapy following surgery - Pre-operative lab work as pre-surgical testing - Prescriptions will be provided in hospital at time of discharge  StShearon BaloPA-C Orthopedic Surgery [facility]

## 2022-10-25 NOTE — Progress Notes (Addendum)
Anesthesia Review:  PCP:  Otilio Miu- LOV 04/10/22 on chart Clearance dated 08/22/22 on chart  Cardiologist : Chest x-ray : EKG : Echo : Stress test: Cardiac Cath :  Activity level:  Sleep Study/ CPAP : Fasting Blood Sugar :      / Checks Blood Sugar -- times a day:   Blood Thinner/ Instructions /Last Dose: ASA / Instructions/ Last Dose :

## 2022-10-25 NOTE — Patient Instructions (Signed)
SURGICAL WAITING ROOM VISITATION  Patients having surgery or a procedure may have no more than 2 support people in the waiting area - these visitors may rotate.    Children under the age of 51 must have an adult with them who is not the patient.  Due to an increase in RSV and influenza rates and associated hospitalizations, children ages 9 and under may not visit patients in [place].  If the patient needs to stay at the hospital during part of their recovery, the visitor guidelines for inpatient rooms apply. Pre-op nurse will coordinate an appropriate time for 1 support person to accompany patient in pre-op.  This support person may not rotate.    Please refer to the [facility] website for the visitor guidelines for Inpatients (after your surgery is over and you are in a regular room).       Your procedure is scheduled on:  11/06/2022    Report to [facility] Main Entrance    Report to admitting at  1245pm      Call this number if you have problems the morning of surgery [phone]   Do not eat food :After Midnight.   After Midnight you may have the following liquids until __ 1215 pm ____  DAY OF SURGERY  Water Non-Citrus Juices (without pulp, NO RED-Apple, White grape, White cranberry) Black Coffee (NO MILK/CREAM OR CREAMERS, sugar ok)  Clear Tea (NO MILK/CREAM OR CREAMERS, sugar ok) regular and decaf                             Plain Jell-O (NO RED)                                           Fruit ices (not with fruit pulp, NO RED)                                     Popsicles (NO RED)                                                               Sports drinks like Gatorade (NO RED)                    The day of surgery:  Drink ONE (1) Pre-Surgery Clear Ensure or G2 at   1215 pm  ( have completed by )  the morning of surgery. Drink in one sitting. Do not sip.  This drink was given to you during your hospital  pre-op appointment visit. Nothing else  to drink after completing the  Pre-Surgery Clear Ensure or G2.          If you have questions, please contact your surgeon's office.        Oral Hygiene is also important to reduce your risk of infection.                                    Remember - BRUSH  YOUR TEETH THE MORNING OF SURGERY WITH YOUR REGULAR TOOTHPASTE  DENTURES WILL BE REMOVED PRIOR TO SURGERY PLEASE DO NOT APPLY "Poly grip" OR ADHESIVES!!!   Do NOT smoke after Midnight   Take these medicines the morning of surgery with A SIP OF WATER:  wellbutrin, cymbalta, nexium, lyrica   DO NOT TAKE ANY ORAL DIABETIC MEDICATIONS DAY OF YOUR SURGERY  Bring CPAP mask and tubing day of surgery.                              You may not have any metal on your body including hair pins, jewelry, and body piercing             Do not wear make-up, lotions, powders, perfumes/cologne, or deodorant  Do not wear nail polish including gel and S&S, artificial/acrylic nails, or any other type of covering on natural nails including finger and toenails. If you have artificial nails, gel coating, etc. that needs to be removed by a nail salon please have this removed prior to surgery or surgery may need to be canceled/ delayed if the surgeon/ anesthesia feels like they are unable to be safely monitored.   Do not shave  48 hours prior to surgery.               Men may shave face and neck.   Do not bring valuables to the hospital. [place].   Contacts, glasses, dentures or bridgework may not be worn into surgery.   Bring small overnight bag day of surgery.   DO NOT [place]. PHARMACY WILL DISPENSE MEDICATIONS LISTED ON YOUR MEDICATION LIST TO YOU DURING YOUR ADMISSION [place]!    Patients discharged on the day of surgery will not be allowed to drive home.  Someone NEEDS to stay with you for the first 24 hours after anesthesia.   Special  Instructions: Bring a copy of your healthcare power of attorney and living will documents the day of surgery if you haven't scanned them before.              Please read over the following fact sheets you were given: IF [place] [phone]   If you received a COVID test during your pre-op visit  it is requested that you wear a mask when out in public, stay away from anyone that may not be feeling well and notify your surgeon if you develop symptoms. If you test positive for Covid or have been in contact with anyone that has tested positive in the last 10 days please notify you surgeon.    [place] - Preparing for Surgery Before surgery, you can play an important role.  Because skin is not sterile, your skin needs to be as free of germs as possible.  You can reduce the number of germs on your skin by washing with CHG (chlorahexidine gluconate) soap before surgery.  CHG is an antiseptic cleaner which kills germs and bonds with the skin to continue killing germs even after washing. Please DO NOT use if you have an allergy to CHG or antibacterial soaps.  If your skin becomes reddened/irritated stop using the CHG and inform your nurse when you arrive at Short Stay. Do not shave (including legs and underarms) for  at least 48 hours prior to the first CHG shower.  You may shave your face/neck. Please follow these instructions carefully:  1.  Shower with CHG Soap the night before surgery and the  morning of Surgery.  2.  If you choose to wash your hair, wash your hair first as usual with your  normal  shampoo.  3.  After you shampoo, rinse your hair and body thoroughly to remove the  shampoo.                           4.  Use CHG as you would any other liquid soap.  You can apply chg directly  to the skin and wash                       Gently with a scrungie or clean washcloth.  5.  Apply the CHG Soap to your body ONLY FROM THE NECK DOWN.   Do not use  on face/ open                           Wound or open sores. Avoid contact with eyes, ears mouth and genitals (private parts).                       Wash face,  Genitals (private parts) with your normal soap.             6.  Wash thoroughly, paying special attention to the area where your surgery  will be performed.  7.  Thoroughly rinse your body with warm water from the neck down.  8.  DO NOT shower/wash with your normal soap after using and rinsing off  the CHG Soap.                9.  Pat yourself dry with a clean towel.            10.  Wear clean pajamas.            11.  Place clean sheets on your bed the night of your first shower and do not  sleep with pets. Day of Surgery : Do not apply any lotions/deodorants the morning of surgery.  Please wear clean clothes to the hospital/surgery center.  FAILURE TO FOLLOW THESE INSTRUCTIONS MAY RESULT IN THE CANCELLATION OF YOUR SURGERY PATIENT SIGNATURE_________________________________  NURSE SIGNATURE__________________________________  ________________________________________________________________________

## 2022-10-28 ENCOUNTER — Encounter (HOSPITAL_COMMUNITY)
Admission: RE | Admit: 2022-10-28 | Discharge: 2022-10-28 | Disposition: A | Discharge status: Home / Self Care | Payer: 59 | Source: Ambulatory Visit | Attending: Orthopedic Surgery | Admitting: Orthopedic Surgery

## 2022-10-28 ENCOUNTER — Encounter (HOSPITAL_COMMUNITY): Payer: Self-pay

## 2022-10-28 ENCOUNTER — Other Ambulatory Visit: Payer: Self-pay

## 2022-10-28 VITALS — BP 123/81 | HR 76 | Temp 98.4°F | Resp 16 | Ht 70.75 in | Wt 205.0 lb

## 2022-10-28 DIAGNOSIS — Z96652 Presence of left artificial knee joint: Secondary | ICD-10-CM | POA: Diagnosis not present

## 2022-10-28 DIAGNOSIS — F17200 Nicotine dependence, unspecified, uncomplicated: Secondary | ICD-10-CM | POA: Insufficient documentation

## 2022-10-28 DIAGNOSIS — Z01818 Encounter for other preprocedural examination: Secondary | ICD-10-CM | POA: Insufficient documentation

## 2022-10-28 DIAGNOSIS — T849XXA Unspecified complication of internal orthopedic prosthetic device, implant and graft, initial encounter: Secondary | ICD-10-CM | POA: Insufficient documentation

## 2022-10-28 HISTORY — DX: Depression, unspecified: F32.A

## 2022-10-28 HISTORY — DX: Other complications of anesthesia, initial encounter: T88.59XA

## 2022-10-28 HISTORY — DX: Anxiety disorder, unspecified: F41.9

## 2022-10-28 LAB — CBC
HCT: 47.7 % (ref 39.0–52.0)
Hemoglobin: 14.6 g/dL (ref 13.0–17.0)
MCH: 27.7 pg (ref 26.0–34.0)
MCHC: 30.6 g/dL (ref 30.0–36.0)
MCV: 90.3 fL (ref 80.0–100.0)
Platelets: 436 10*3/uL — ABNORMAL HIGH (ref 150–400)
RBC: 5.28 MIL/uL (ref 4.22–5.81)
RDW: 18.1 % — ABNORMAL HIGH (ref 11.5–15.5)
WBC: 10.4 10*3/uL (ref 4.0–10.5)
nRBC: 0 % (ref 0.0–0.2)

## 2022-10-28 LAB — BASIC METABOLIC PANEL
Anion gap: 11 (ref 5–15)
BUN: 24 mg/dL — ABNORMAL HIGH (ref 8–23)
CO2: 22 mmol/L (ref 22–32)
Calcium: 9.2 mg/dL (ref 8.9–10.3)
Chloride: 107 mmol/L (ref 98–111)
Creatinine, Ser: 1.5 mg/dL — ABNORMAL HIGH (ref 0.61–1.24)
GFR, Estimated: 52 mL/min — ABNORMAL LOW (ref >60)
Glucose, Bld: 106 mg/dL — ABNORMAL HIGH (ref 70–99)
Potassium: 4.2 mmol/L (ref 3.5–5.1)
Sodium: 140 mmol/L (ref 135–145)

## 2022-10-28 LAB — SURGICAL PCR SCREEN
MRSA, PCR: NEGATIVE
Staphylococcus aureus: NEGATIVE

## 2022-10-29 NOTE — Progress Notes (Signed)
Anesthesia Chart Review   Case: [id number] Date/Time: 11/06/22 1505   Procedure: Revision left knee unicompartmental arthroplasty to total knee arthroplasty (Left: Knee)   Anesthesia type: Choice   Pre-op diagnosis: Failed left knee unicompartmental arthroplasty   Location: [facility] ROOM 10 / [facility]   Surgeons: Gaynelle Arabian, MD       DISCUSSION:62 y.o. smoker with h/o GERD, failed left knee unicompartmental arthroplasty scheduled for above procedure 11/06/2022 with Dr. Gaynelle Arabian.   Echo 03/20/2022 SUMMARY  Left ventricular systolic function is normal.  LV ejection fraction = 55-60%.  No segmental wall motion abnormalities seen in the left ventricle  Left ventricular filling pattern is indeterminate.  The right ventricle is normal in size and function.  The right atrium is mildly dilated.  There is no significant valvular stenosis or regurgitation.  IVC size was normal.  There is no pericardial effusion.   PAT EKG similar to EKG reading 06/20/2021  Clearance from PCP on chart which states pt is low risk.   Anticipate pt can proceed with planned procedure barring acute status change.  .  VS: BP 123/81   Pulse 76   Temp 36.9 C (Oral)   Resp 16   Ht 5' 10.75" (1.797 m)   Wt 93 kg   SpO2 98%   BMI 28.79 kg/m   PROVIDERS: Juline Patch, MD is PCP    LABS: Labs reviewed: Acceptable for surgery. (all labs ordered are listed, but only abnormal results are displayed)  Labs Reviewed  CBC - Abnormal; Notable for the following components:      Result Value   RDW 18.1 (*)    Platelets 436 (*)    All other components within normal limits  BASIC METABOLIC PANEL - Abnormal; Notable for the following components:   Glucose, Bld 106 (*)    BUN 24 (*)    Creatinine, Ser 1.50 (*)    GFR, Estimated 52 (*)    All other components within normal limits  SURGICAL PCR SCREEN     IMAGES:   EKG:   CV: Echo 03/20/2022 SUMMARY  Left ventricular systolic function is normal.   LV ejection fraction = 55-60%.  No segmental wall motion abnormalities seen in the left ventricle  Left ventricular filling pattern is indeterminate.  The right ventricle is normal in size and function.  The right atrium is mildly dilated.  There is no significant valvular stenosis or regurgitation.  IVC size was normal.  There is no pericardial effusion.  Past Medical History:  Diagnosis Date   Anemia    Anxiety    Arthritis    Attention deficit disorder    Complication of anesthesia    40 years ago had surgery where could not get breath right as receiving preop meds no problekms since   Depression    Diaphoresis    Dyspnea    with exertion   GERD (gastroesophageal reflux disease)    Heart murmur    High blood pressure    not on meds x 6 month sor longer or a year   High cholesterol    History of kidney stones     Past Surgical History:  Procedure Laterality Date   BACK SURGERY     total of 7 back surgeries   BILATERAL CARPAL TUNNEL RELEASE     HERNIA REPAIR     KIDNEY STONE SURGERY     KIDNEY STONE SURGERY     x 2   KNEE SURGERY  LUMBAR FUSION     MEDIAL PARTIAL KNEE REPLACEMENT     REPLACEMENT TOTAL KNEE     SPINE SURGERY     TOTAL KNEE REVISION Right 11/28/2021   Procedure: TOTAL KNEE REVISION;  Surgeon: Gaynelle Arabian, MD;  Location: [facility];  Service: Orthopedics;  Laterality: Right;    MEDICATIONS:  amphetamine-dextroamphetamine (ADDERALL) 20 MG tablet   baclofen (LIORESAL) 10 MG tablet   buPROPion (WELLBUTRIN XL) 300 MG 24 hr tablet   diclofenac (VOLTAREN) 75 MG EC tablet   diclofenac Sodium (VOLTAREN) 1 % GEL   DULoxetine (CYMBALTA) 60 MG capsule   furosemide (LASIX) 20 MG tablet   methocarbamol (ROBAXIN) 500 MG tablet   naloxone (NARCAN) nasal spray 4 mg/0.1 mL   omeprazole (PRILOSEC) 20 MG capsule   oxyCODONE (OXY IR/ROXICODONE) 5 MG immediate release tablet   pramipexole (MIRAPEX) 0.5 MG tablet   pregabalin (LYRICA) 100 MG capsule    tadalafil (CIALIS) 20 MG tablet   Testosterone Cypionate 200 MG/ML SOLN   valACYclovir (VALTREX) 500 MG tablet   No current facility-administered medications for this encounter.      Konrad Felix Ward, PA-C [facility]Surgical Testing [phone]

## 2022-11-04 DIAGNOSIS — M25569 Pain in unspecified knee: Secondary | ICD-10-CM | POA: Diagnosis not present

## 2022-11-04 DIAGNOSIS — M25519 Pain in unspecified shoulder: Secondary | ICD-10-CM | POA: Diagnosis not present

## 2022-11-04 DIAGNOSIS — Z79899 Other long term (current) drug therapy: Secondary | ICD-10-CM | POA: Diagnosis not present

## 2022-11-06 ENCOUNTER — Encounter (HOSPITAL_COMMUNITY): Payer: Self-pay | Admitting: Orthopedic Surgery

## 2022-11-06 ENCOUNTER — Inpatient Hospital Stay (HOSPITAL_COMMUNITY)
Admission: RE | Admit: 2022-11-06 | Discharge: 2022-11-09 | DRG: 467 | Disposition: A | Discharge status: Home / Self Care | Payer: 59 | Attending: Orthopedic Surgery | Admitting: Orthopedic Surgery

## 2022-11-06 ENCOUNTER — Inpatient Hospital Stay (HOSPITAL_COMMUNITY): Payer: 59 | Admitting: Certified Registered Nurse Anesthetist

## 2022-11-06 ENCOUNTER — Other Ambulatory Visit: Payer: Self-pay

## 2022-11-06 ENCOUNTER — Inpatient Hospital Stay (HOSPITAL_COMMUNITY): Payer: 59 | Admitting: Physician Assistant

## 2022-11-06 ENCOUNTER — Encounter (HOSPITAL_COMMUNITY)
Admission: RE | Disposition: A | Discharge status: Home / Self Care | Payer: Self-pay | Source: Home / Self Care | Attending: Orthopedic Surgery

## 2022-11-06 DIAGNOSIS — Z981 Arthrodesis status: Secondary | ICD-10-CM | POA: Diagnosis not present

## 2022-11-06 DIAGNOSIS — F17210 Nicotine dependence, cigarettes, uncomplicated: Secondary | ICD-10-CM | POA: Diagnosis present

## 2022-11-06 DIAGNOSIS — T84033A Mechanical loosening of internal left knee prosthetic joint, initial encounter: Secondary | ICD-10-CM | POA: Diagnosis not present

## 2022-11-06 DIAGNOSIS — D62 Acute posthemorrhagic anemia: Secondary | ICD-10-CM | POA: Diagnosis not present

## 2022-11-06 DIAGNOSIS — E7800 Pure hypercholesterolemia, unspecified: Secondary | ICD-10-CM | POA: Diagnosis present

## 2022-11-06 DIAGNOSIS — F32A Depression, unspecified: Secondary | ICD-10-CM | POA: Diagnosis present

## 2022-11-06 DIAGNOSIS — K449 Diaphragmatic hernia without obstruction or gangrene: Secondary | ICD-10-CM | POA: Diagnosis not present

## 2022-11-06 DIAGNOSIS — Z96652 Presence of left artificial knee joint: Secondary | ICD-10-CM | POA: Diagnosis not present

## 2022-11-06 DIAGNOSIS — T84019A Broken internal joint prosthesis, unspecified site, initial encounter: Secondary | ICD-10-CM | POA: Diagnosis present

## 2022-11-06 DIAGNOSIS — T84013A Broken internal left knee prosthesis, initial encounter: Secondary | ICD-10-CM

## 2022-11-06 DIAGNOSIS — Y831 Surgical operation with implant of artificial internal device as the cause of abnormal reaction of the patient, or of later complication, without mention of misadventure at the time of the procedure: Secondary | ICD-10-CM | POA: Diagnosis present

## 2022-11-06 DIAGNOSIS — Z96651 Presence of right artificial knee joint: Secondary | ICD-10-CM | POA: Diagnosis not present

## 2022-11-06 DIAGNOSIS — Z79899 Other long term (current) drug therapy: Secondary | ICD-10-CM

## 2022-11-06 DIAGNOSIS — Z91048 Other nonmedicinal substance allergy status: Secondary | ICD-10-CM | POA: Diagnosis not present

## 2022-11-06 DIAGNOSIS — G8918 Other acute postprocedural pain: Secondary | ICD-10-CM | POA: Diagnosis not present

## 2022-11-06 DIAGNOSIS — Z01818 Encounter for other preprocedural examination: Secondary | ICD-10-CM

## 2022-11-06 DIAGNOSIS — K219 Gastro-esophageal reflux disease without esophagitis: Secondary | ICD-10-CM | POA: Diagnosis present

## 2022-11-06 DIAGNOSIS — T8484XA Pain due to internal orthopedic prosthetic devices, implants and grafts, initial encounter: Principal | ICD-10-CM

## 2022-11-06 DIAGNOSIS — Z96659 Presence of unspecified artificial knee joint: Principal | ICD-10-CM

## 2022-11-06 HISTORY — PX: CONVERSION TO TOTAL KNEE: SHX5785

## 2022-11-06 SURGERY — CONVERSION, ARTHROPLASTY, KNEE, PARTIAL, TO TOTAL KNEE ARTHROPLASTY
Anesthesia: General | Site: Knee | Laterality: Left

## 2022-11-06 MED ORDER — LIDOCAINE 2% (20 MG/ML) 5 ML SYRINGE
INTRAMUSCULAR | Status: DC | PRN
  Administered 2022-11-06: 80 mg via INTRAVENOUS

## 2022-11-06 MED ORDER — MIDAZOLAM HCL 2 MG/2ML IJ SOLN: 2.0000 mg | INTRAMUSCULAR | Status: AC

## 2022-11-06 MED ORDER — FENTANYL CITRATE PF 50 MCG/ML IJ SOSY
PREFILLED_SYRINGE | INTRAMUSCULAR | Status: AC
  Administered 2022-11-06: 100 ug via INTRAVENOUS
  Filled 2022-11-06: qty 2

## 2022-11-06 MED ORDER — POVIDONE-IODINE 10 % EX SWAB: 2.0000 | Freq: Once | CUTANEOUS | Status: DC

## 2022-11-06 MED ORDER — KETOROLAC TROMETHAMINE 30 MG/ML IJ SOLN
INTRAMUSCULAR | Status: DC | PRN
  Administered 2022-11-06: 30 mg via INTRAVENOUS

## 2022-11-06 MED ORDER — PROMETHAZINE HCL 25 MG/ML IJ SOLN: 6.2500 mg | INTRAMUSCULAR | Status: DC | PRN

## 2022-11-06 MED ORDER — HYDROMORPHONE HCL 2 MG PO TABS
2.0000 mg | ORAL_TABLET | Freq: Four times a day (QID) | ORAL | Status: DC | PRN
  Administered 2022-11-06 – 2022-11-09 (×9): 4 mg via ORAL
  Filled 2022-11-06 (×9): qty 2

## 2022-11-06 MED ORDER — ROCURONIUM BROMIDE 10 MG/ML (PF) SYRINGE
PREFILLED_SYRINGE | INTRAVENOUS | Status: DC | PRN
  Administered 2022-11-06: 70 mg via INTRAVENOUS

## 2022-11-06 MED ORDER — ONDANSETRON HCL 4 MG/2ML IJ SOLN
INTRAMUSCULAR | Status: AC
  Filled 2022-11-06: qty 2

## 2022-11-06 MED ORDER — EPHEDRINE SULFATE-NACL 50-0.9 MG/10ML-% IV SOSY
PREFILLED_SYRINGE | INTRAVENOUS | Status: DC | PRN
  Administered 2022-11-06 (×4): 5 mg via INTRAVENOUS

## 2022-11-06 MED ORDER — SODIUM CHLORIDE (PF) 0.9 % IJ SOLN
INTRAMUSCULAR | Status: DC | PRN
  Administered 2022-11-06: 60 mL

## 2022-11-06 MED ORDER — LACTATED RINGERS IV SOLN: INTRAVENOUS | Status: DC

## 2022-11-06 MED ORDER — FENTANYL CITRATE (PF) 100 MCG/2ML IJ SOLN
INTRAMUSCULAR | Status: AC
  Filled 2022-11-06: qty 2

## 2022-11-06 MED ORDER — KETOROLAC TROMETHAMINE 30 MG/ML IJ SOLN: 30.0000 mg | Freq: Once | INTRAMUSCULAR | Status: DC | PRN

## 2022-11-06 MED ORDER — KETAMINE HCL 50 MG/5ML IJ SOSY
PREFILLED_SYRINGE | INTRAMUSCULAR | Status: AC
  Filled 2022-11-06: qty 5

## 2022-11-06 MED ORDER — CHLORHEXIDINE GLUCONATE 0.12 % MT SOLN
15.0000 mL | Freq: Once | OROMUCOSAL | Status: AC
  Administered 2022-11-06: 15 mL via OROMUCOSAL

## 2022-11-06 MED ORDER — ASPIRIN 81 MG PO CHEW
81.0000 mg | CHEWABLE_TABLET | Freq: Two times a day (BID) | ORAL | Status: DC
  Administered 2022-11-07 – 2022-11-09 (×5): 81 mg via ORAL
  Filled 2022-11-06 (×5): qty 1

## 2022-11-06 MED ORDER — STERILE WATER FOR IRRIGATION IR SOLN
Status: DC | PRN
  Administered 2022-11-06: 2000 mL

## 2022-11-06 MED ORDER — SODIUM CHLORIDE (PF) 0.9 % IJ SOLN
INTRAMUSCULAR | Status: AC
  Filled 2022-11-06: qty 50

## 2022-11-06 MED ORDER — BISACODYL 10 MG RE SUPP: 10.0000 mg | Freq: Every day | RECTAL | Status: DC | PRN

## 2022-11-06 MED ORDER — ORAL CARE MOUTH RINSE: 15.0000 mL | Freq: Once | OROMUCOSAL | Status: AC

## 2022-11-06 MED ORDER — FENTANYL CITRATE PF 50 MCG/ML IJ SOSY
25.0000 ug | PREFILLED_SYRINGE | Freq: Once | INTRAMUSCULAR | Status: DC
  Filled 2022-11-06: qty 2

## 2022-11-06 MED ORDER — SUGAMMADEX SODIUM 200 MG/2ML IV SOLN
INTRAVENOUS | Status: DC | PRN
  Administered 2022-11-06: 200 mg via INTRAVENOUS

## 2022-11-06 MED ORDER — BUPIVACAINE LIPOSOME 1.3 % IJ SUSP
INTRAMUSCULAR | Status: DC | PRN
  Administered 2022-11-06: 20 mL

## 2022-11-06 MED ORDER — KETAMINE HCL 10 MG/ML IJ SOLN
INTRAMUSCULAR | Status: DC | PRN
  Administered 2022-11-06: 10 mg via INTRAVENOUS
  Administered 2022-11-06: 20 mg via INTRAVENOUS
  Administered 2022-11-06: 10 mg via INTRAVENOUS

## 2022-11-06 MED ORDER — METOCLOPRAMIDE HCL 5 MG PO TABS: 5.0000 mg | ORAL_TABLET | Freq: Three times a day (TID) | ORAL | Status: DC | PRN

## 2022-11-06 MED ORDER — SODIUM CHLORIDE (PF) 0.9 % IJ SOLN
INTRAMUSCULAR | Status: AC
  Filled 2022-11-06: qty 10

## 2022-11-06 MED ORDER — DIPHENHYDRAMINE HCL 12.5 MG/5ML PO ELIX: 12.5000 mg | ORAL_SOLUTION | ORAL | Status: DC | PRN

## 2022-11-06 MED ORDER — ONDANSETRON HCL 4 MG/2ML IJ SOLN: 4.0000 mg | Freq: Four times a day (QID) | INTRAMUSCULAR | Status: DC | PRN

## 2022-11-06 MED ORDER — EPHEDRINE 5 MG/ML INJ
INTRAVENOUS | Status: AC
  Filled 2022-11-06: qty 5

## 2022-11-06 MED ORDER — PROPOFOL 10 MG/ML IV BOLUS
INTRAVENOUS | Status: AC
  Filled 2022-11-06: qty 20

## 2022-11-06 MED ORDER — DOCUSATE SODIUM 100 MG PO CAPS
100.0000 mg | ORAL_CAPSULE | Freq: Two times a day (BID) | ORAL | Status: DC
  Administered 2022-11-06 – 2022-11-09 (×6): 100 mg via ORAL
  Filled 2022-11-06 (×6): qty 1

## 2022-11-06 MED ORDER — METHOCARBAMOL 500 MG IVPB - SIMPLE MED
INTRAVENOUS | Status: AC
  Filled 2022-11-06: qty 55

## 2022-11-06 MED ORDER — PREGABALIN 100 MG PO CAPS
100.0000 mg | ORAL_CAPSULE | Freq: Every day | ORAL | Status: DC
  Administered 2022-11-07 – 2022-11-09 (×3): 100 mg via ORAL
  Filled 2022-11-06 (×3): qty 1

## 2022-11-06 MED ORDER — ACETAMINOPHEN 500 MG PO TABS
1000.0000 mg | ORAL_TABLET | Freq: Four times a day (QID) | ORAL | Status: AC
  Administered 2022-11-07 (×3): 1000 mg via ORAL
  Filled 2022-11-06 (×4): qty 2

## 2022-11-06 MED ORDER — PROPOFOL 10 MG/ML IV BOLUS
INTRAVENOUS | Status: DC | PRN
  Administered 2022-11-06: 200 mg via INTRAVENOUS

## 2022-11-06 MED ORDER — PHENOL 1.4 % MT LIQD: 1.0000 | OROMUCOSAL | Status: DC | PRN

## 2022-11-06 MED ORDER — DEXAMETHASONE SODIUM PHOSPHATE 10 MG/ML IJ SOLN
10.0000 mg | Freq: Once | INTRAMUSCULAR | Status: AC
  Administered 2022-11-07: 10 mg via INTRAVENOUS
  Filled 2022-11-06: qty 1

## 2022-11-06 MED ORDER — METHOCARBAMOL 500 MG IVPB - SIMPLE MED
500.0000 mg | Freq: Four times a day (QID) | INTRAVENOUS | Status: DC | PRN
  Administered 2022-11-06: 500 mg via INTRAVENOUS

## 2022-11-06 MED ORDER — OXYCODONE HCL 5 MG PO TABS
10.0000 mg | ORAL_TABLET | Freq: Four times a day (QID) | ORAL | Status: DC
  Administered 2022-11-06 – 2022-11-09 (×12): 10 mg via ORAL
  Filled 2022-11-06 (×12): qty 2

## 2022-11-06 MED ORDER — HYDROMORPHONE HCL 1 MG/ML IJ SOLN
INTRAMUSCULAR | Status: DC | PRN
  Administered 2022-11-06 (×2): .5 mg via INTRAVENOUS

## 2022-11-06 MED ORDER — PANTOPRAZOLE SODIUM 40 MG PO TBEC
40.0000 mg | DELAYED_RELEASE_TABLET | Freq: Every day | ORAL | Status: DC
  Administered 2022-11-07 – 2022-11-09 (×3): 40 mg via ORAL
  Filled 2022-11-06 (×3): qty 1

## 2022-11-06 MED ORDER — DEXAMETHASONE SODIUM PHOSPHATE 10 MG/ML IJ SOLN
8.0000 mg | Freq: Once | INTRAMUSCULAR | Status: AC
  Administered 2022-11-06: 8 mg via INTRAVENOUS

## 2022-11-06 MED ORDER — PHENYLEPHRINE HCL-NACL 20-0.9 MG/250ML-% IV SOLN
INTRAVENOUS | Status: DC | PRN
  Administered 2022-11-06: 40 ug/min via INTRAVENOUS

## 2022-11-06 MED ORDER — FENTANYL CITRATE PF 50 MCG/ML IJ SOSY
100.0000 ug | PREFILLED_SYRINGE | INTRAMUSCULAR | Status: AC
  Administered 2022-11-06: 50 ug via INTRAVENOUS

## 2022-11-06 MED ORDER — CEFAZOLIN SODIUM-DEXTROSE 2-4 GM/100ML-% IV SOLN
2.0000 g | Freq: Four times a day (QID) | INTRAVENOUS | Status: AC
  Administered 2022-11-06 – 2022-11-07 (×2): 2 g via INTRAVENOUS
  Filled 2022-11-06 (×2): qty 100

## 2022-11-06 MED ORDER — PREGABALIN 100 MG PO CAPS: 100.0000 mg | ORAL_CAPSULE | ORAL | Status: DC

## 2022-11-06 MED ORDER — AMPHETAMINE-DEXTROAMPHETAMINE 10 MG PO TABS
20.0000 mg | ORAL_TABLET | Freq: Two times a day (BID) | ORAL | Status: DC
  Administered 2022-11-07 – 2022-11-09 (×6): 20 mg via ORAL
  Filled 2022-11-06 (×7): qty 2

## 2022-11-06 MED ORDER — POLYETHYLENE GLYCOL 3350 17 G PO PACK
17.0000 g | PACK | Freq: Every day | ORAL | Status: DC | PRN
  Administered 2022-11-07: 17 g via ORAL
  Filled 2022-11-06: qty 1

## 2022-11-06 MED ORDER — HYDROMORPHONE HCL 2 MG/ML IJ SOLN
INTRAMUSCULAR | Status: AC
  Filled 2022-11-06: qty 1

## 2022-11-06 MED ORDER — ROCURONIUM BROMIDE 10 MG/ML (PF) SYRINGE
PREFILLED_SYRINGE | INTRAVENOUS | Status: AC
  Filled 2022-11-06: qty 10

## 2022-11-06 MED ORDER — CEFAZOLIN SODIUM-DEXTROSE 2-4 GM/100ML-% IV SOLN
2.0000 g | INTRAVENOUS | Status: AC
  Administered 2022-11-06: 2 g via INTRAVENOUS
  Filled 2022-11-06: qty 100

## 2022-11-06 MED ORDER — HYDROMORPHONE HCL 1 MG/ML IJ SOLN
INTRAMUSCULAR | Status: AC
  Filled 2022-11-06: qty 1

## 2022-11-06 MED ORDER — ONDANSETRON HCL 4 MG PO TABS: 4.0000 mg | ORAL_TABLET | Freq: Four times a day (QID) | ORAL | Status: DC | PRN

## 2022-11-06 MED ORDER — BUPIVACAINE LIPOSOME 1.3 % IJ SUSP: 20.0000 mL | Freq: Once | INTRAMUSCULAR | Status: DC

## 2022-11-06 MED ORDER — FLEET ENEMA 7-19 GM/118ML RE ENEM: 1.0000 | ENEMA | Freq: Once | RECTAL | Status: DC | PRN

## 2022-11-06 MED ORDER — FENTANYL CITRATE (PF) 100 MCG/2ML IJ SOLN
INTRAMUSCULAR | Status: DC | PRN
  Administered 2022-11-06 (×2): 50 ug via INTRAVENOUS

## 2022-11-06 MED ORDER — DULOXETINE HCL 60 MG PO CPEP
120.0000 mg | ORAL_CAPSULE | Freq: Every morning | ORAL | Status: DC
  Administered 2022-11-07 – 2022-11-09 (×3): 120 mg via ORAL
  Filled 2022-11-06 (×3): qty 2

## 2022-11-06 MED ORDER — MIDAZOLAM HCL 2 MG/2ML IJ SOLN
INTRAMUSCULAR | Status: AC
  Administered 2022-11-06: 2 mg via INTRAVENOUS
  Filled 2022-11-06: qty 2

## 2022-11-06 MED ORDER — MEPERIDINE HCL 50 MG/ML IJ SOLN: 6.2500 mg | INTRAMUSCULAR | Status: DC | PRN

## 2022-11-06 MED ORDER — SODIUM CHLORIDE 0.9 % IV SOLN: INTRAVENOUS | Status: DC

## 2022-11-06 MED ORDER — ONDANSETRON HCL 4 MG/2ML IJ SOLN
INTRAMUSCULAR | Status: DC | PRN
  Administered 2022-11-06: 4 mg via INTRAVENOUS

## 2022-11-06 MED ORDER — PRAMIPEXOLE DIHYDROCHLORIDE 0.25 MG PO TABS
0.5000 mg | ORAL_TABLET | Freq: Every day | ORAL | Status: DC
  Administered 2022-11-06 – 2022-11-08 (×3): 0.5 mg via ORAL
  Filled 2022-11-06 (×3): qty 2

## 2022-11-06 MED ORDER — SODIUM CHLORIDE 0.9 % IR SOLN
Status: DC | PRN
  Administered 2022-11-06: 3000 mL

## 2022-11-06 MED ORDER — MENTHOL 3 MG MT LOZG: 1.0000 | LOZENGE | OROMUCOSAL | Status: DC | PRN

## 2022-11-06 MED ORDER — LIDOCAINE HCL (PF) 2 % IJ SOLN
INTRAMUSCULAR | Status: AC
  Filled 2022-11-06: qty 5

## 2022-11-06 MED ORDER — HYDROMORPHONE HCL 1 MG/ML IJ SOLN
0.5000 mg | INTRAMUSCULAR | Status: DC | PRN
  Administered 2022-11-07: 0.5 mg via INTRAVENOUS
  Filled 2022-11-06: qty 1

## 2022-11-06 MED ORDER — DEXAMETHASONE SODIUM PHOSPHATE 10 MG/ML IJ SOLN
INTRAMUSCULAR | Status: AC
  Filled 2022-11-06: qty 1

## 2022-11-06 MED ORDER — ACETAMINOPHEN 10 MG/ML IV SOLN
1000.0000 mg | Freq: Four times a day (QID) | INTRAVENOUS | Status: DC
  Administered 2022-11-06: 1000 mg via INTRAVENOUS
  Filled 2022-11-06: qty 100

## 2022-11-06 MED ORDER — MIDAZOLAM HCL 2 MG/2ML IJ SOLN
1.0000 mg | Freq: Once | INTRAMUSCULAR | Status: DC
  Administered 2022-11-06: 1 mg via INTRAVENOUS
  Filled 2022-11-06: qty 2

## 2022-11-06 MED ORDER — HYDROMORPHONE HCL 1 MG/ML IJ SOLN
0.2500 mg | INTRAMUSCULAR | Status: DC | PRN
  Administered 2022-11-06 (×2): 0.5 mg via INTRAVENOUS

## 2022-11-06 MED ORDER — 0.9 % SODIUM CHLORIDE (POUR BTL) OPTIME
TOPICAL | Status: DC | PRN
  Administered 2022-11-06: 1000 mL

## 2022-11-06 MED ORDER — BUPROPION HCL ER (XL) 300 MG PO TB24
300.0000 mg | ORAL_TABLET | Freq: Every day | ORAL | Status: DC
  Administered 2022-11-07 – 2022-11-09 (×3): 300 mg via ORAL
  Filled 2022-11-06 (×3): qty 1

## 2022-11-06 MED ORDER — TRANEXAMIC ACID-NACL 1000-0.7 MG/100ML-% IV SOLN
1000.0000 mg | INTRAVENOUS | Status: AC
  Administered 2022-11-06: 1000 mg via INTRAVENOUS
  Filled 2022-11-06: qty 100

## 2022-11-06 MED ORDER — BUPIVACAINE LIPOSOME 1.3 % IJ SUSP
INTRAMUSCULAR | Status: AC
  Filled 2022-11-06: qty 20

## 2022-11-06 MED ORDER — PREGABALIN 100 MG PO CAPS
200.0000 mg | ORAL_CAPSULE | Freq: Every day | ORAL | Status: DC
  Administered 2022-11-06 – 2022-11-08 (×3): 200 mg via ORAL
  Filled 2022-11-06 (×3): qty 2

## 2022-11-06 MED ORDER — METHOCARBAMOL 500 MG PO TABS
500.0000 mg | ORAL_TABLET | Freq: Four times a day (QID) | ORAL | Status: DC | PRN
  Administered 2022-11-07 – 2022-11-09 (×4): 500 mg via ORAL
  Filled 2022-11-06 (×4): qty 1

## 2022-11-06 MED ORDER — METOCLOPRAMIDE HCL 5 MG/ML IJ SOLN: 5.0000 mg | Freq: Three times a day (TID) | INTRAMUSCULAR | Status: DC | PRN

## 2022-11-06 SURGICAL SUPPLY — 68 items
ATTUNE MED DOME PAT 41 KNEE (Knees) IMPLANT
ATTUNE PS FEM LT SZ 7 CEM KNEE (Femur) IMPLANT
ATTUNE PSRP INSR SZ7 7 KNEE (Insert) IMPLANT
AUG TIB 7/8 10 REV CMNT LM/RL (Joint) ×1 IMPLANT
AUG TIB ATTUNE LM/RL SZ7/8X10 (Joint) ×1 IMPLANT
AUGMENT TIB ATT LM/RL SZ7/8X10 (Joint) IMPLANT
BAG COUNTER SPONGE SURGICOUNT (BAG) IMPLANT
BAG SPEC THK2 15X12 ZIP CLS (MISCELLANEOUS) ×1
BAG SPNG CNTER NS LX DISP (BAG)
BAG ZIPLOCK 12X15 (MISCELLANEOUS) ×1 IMPLANT
BLADE SAG 18X100X1.27 (BLADE) ×1 IMPLANT
BLADE SAW SGTL 11.0X1.19X90.0M (BLADE) ×1 IMPLANT
BNDG CMPR 5X62 HK CLSR LF (GAUZE/BANDAGES/DRESSINGS) ×1
BNDG CMPR MED 10X6 ELC LF (GAUZE/BANDAGES/DRESSINGS) ×1
BNDG ELASTIC 6INX 5YD STR LF (GAUZE/BANDAGES/DRESSINGS) ×1 IMPLANT
BNDG ELASTIC 6X10 VLCR STRL LF (GAUZE/BANDAGES/DRESSINGS) IMPLANT
BOWL SMART MIX CTS (DISPOSABLE) ×1 IMPLANT
BSPLAT TIB 8 CMNT REV ROT PLAT (Knees) ×1 IMPLANT
CEMENT HV SMART SET (Cement) ×2 IMPLANT
COVER SURGICAL LIGHT HANDLE (MISCELLANEOUS) ×1 IMPLANT
CUFF TOURN SGL QUICK 34 (TOURNIQUET CUFF) ×1
CUFF TRNQT CYL 34X4.125X (TOURNIQUET CUFF) ×1 IMPLANT
DRAPE INCISE IOBAN 66X45 STRL (DRAPES) ×3 IMPLANT
DRAPE POUCH INSTRU U-SHP 10X18 (DRAPES) ×1 IMPLANT
DRAPE U-SHAPE 47X51 STRL (DRAPES) ×1 IMPLANT
DRSG ADAPTIC 3X8 NADH LF (GAUZE/BANDAGES/DRESSINGS) ×1 IMPLANT
DRSG AQUACEL AG ADV 3.5X10 (GAUZE/BANDAGES/DRESSINGS) IMPLANT
DURAPREP 26ML APPLICATOR (WOUND CARE) ×1 IMPLANT
ELECT REM PT RETURN 15FT ADLT (MISCELLANEOUS) ×1 IMPLANT
EVACUATOR 1/8 PVC DRAIN (DRAIN) ×1 IMPLANT
GAUZE PAD ABD 8X10 STRL (GAUZE/BANDAGES/DRESSINGS) ×1 IMPLANT
GAUZE SPONGE 4X4 12PLY STRL (GAUZE/BANDAGES/DRESSINGS) ×1 IMPLANT
GLOVE BIO SURGEON STRL SZ 6.5 (GLOVE) ×2 IMPLANT
GLOVE BIO SURGEON STRL SZ8 (GLOVE) ×2 IMPLANT
GLOVE BIOGEL PI IND STRL 7.0 (GLOVE) ×2 IMPLANT
GLOVE BIOGEL PI IND STRL 8 (GLOVE) ×1 IMPLANT
GLOVE SURG SS PI 7.0 STRL IVOR (GLOVE) ×1 IMPLANT
GOWN STRL REUS W/ TWL LRG LVL3 (GOWN DISPOSABLE) ×3 IMPLANT
GOWN STRL REUS W/TWL LRG LVL3 (GOWN DISPOSABLE) ×3
HANDPIECE INTERPULSE COAX TIP (DISPOSABLE) ×1
IMMOBILIZER KNEE 20 (SOFTGOODS) ×1
IMMOBILIZER KNEE 20 THIGH 36 (SOFTGOODS) ×1 IMPLANT
INSERT TIB CMT ATTUNE RP SZ8 (Knees) IMPLANT
KIT TURNOVER KIT A (KITS) IMPLANT
MANIFOLD NEPTUNE II (INSTRUMENTS) ×1 IMPLANT
NS IRRIG 1000ML POUR BTL (IV SOLUTION) ×1 IMPLANT
PACK TOTAL KNEE CUSTOM (KITS) ×1 IMPLANT
PADDING CAST COTTON 6X4 STRL (CAST SUPPLIES) ×3 IMPLANT
PIN STEINMAN FIXATION KNEE (PIN) IMPLANT
PROTECTOR NERVE ULNAR (MISCELLANEOUS) ×1 IMPLANT
RESTRICTOR CEMENT SZ 5 C-STEM (Cement) IMPLANT
REV ATTUNE STEM 14X30 KNEE (Orthopedic Implant) ×1 IMPLANT
SET HNDPC FAN SPRY TIP SCT (DISPOSABLE) ×1 IMPLANT
SLEEVE TIB CMT ATTUNE 29 (Sleeve) IMPLANT
STEM REV ATTUNE 14X30 KNEE (Orthopedic Implant) IMPLANT
STRIP CLOSURE SKIN 1/2X4 (GAUZE/BANDAGES/DRESSINGS) ×2 IMPLANT
SUCTION FRAZIER HANDLE 12FR (TUBING) ×1
SUCTION TUBE FRAZIER 12FR DISP (TUBING) ×1 IMPLANT
SUT MNCRL AB 4-0 PS2 18 (SUTURE) ×1 IMPLANT
SUT PDS AB 1 CT1 27 (SUTURE) ×3 IMPLANT
SUT STRATAFIX 0 PDS 27 VIOLET (SUTURE) ×1
SUT VIC AB 2-0 CT1 27 (SUTURE) ×3
SUT VIC AB 2-0 CT1 TAPERPNT 27 (SUTURE) ×3 IMPLANT
SUTURE STRATFX 0 PDS 27 VIOLET (SUTURE) ×1 IMPLANT
TOWEL OR 17X26 10 PK STRL BLUE (TOWEL DISPOSABLE) ×2 IMPLANT
TRAY FOLEY MTR SLVR 16FR STAT (SET/KITS/TRAYS/PACK) ×1 IMPLANT
WATER STERILE IRR 1000ML POUR (IV SOLUTION) ×1 IMPLANT
WRAP KNEE MAXI GEL POST OP (GAUZE/BANDAGES/DRESSINGS) ×2 IMPLANT

## 2022-11-06 NOTE — Discharge Instructions (Signed)
 Charles Aluisio, MD Total Joint Specialist [facility] [address] [place], [place] [address] [phone]  TOTAL KNEE REPLACEMENT POSTOPERATIVE DIRECTIONS    Knee Rehabilitation, Guidelines Following Surgery  Results after knee surgery are often greatly improved when you follow the exercise, range of motion and muscle strengthening exercises prescribed by your doctor. Safety measures are also important to protect the knee from further injury. If any of these exercises cause you to have increased pain or swelling in your knee joint, decrease the amount until you are comfortable again and slowly increase them. If you have problems or questions, call your caregiver or physical therapist for advice.   BLOOD CLOT PREVENTION Take 81 mg Aspirin two times a day for three weeks following surgery. Then take an 81 mg Aspirin once a day for three weeks. Then discontinue Aspirin. You may resume your vitamins/supplements upon discharge from the hospital. Do not take any NSAIDs (Advil, Aleve, Ibuprofen, Meloxicam, etc.) for 3 weeks, while taking 81mg Aspirin twice a day.   HOME CARE INSTRUCTIONS  Remove items at home which could result in a fall. This includes throw rugs or furniture in walking pathways.  ICE to the affected knee as much as tolerated. Icing helps control swelling. If the swelling is well controlled you will be more comfortable and rehab easier. Continue to use ice on the knee for pain and swelling from surgery. You may notice swelling that will progress down to the foot and ankle. This is normal after surgery. Elevate the leg when you are not up walking on it.    Continue to use the breathing machine which will help keep your temperature down. It is common for your temperature to cycle up and down following surgery, especially at night when you are not up moving around and exerting yourself. The breathing machine keeps your lungs expanded and your temperature  down. Do not place pillow under the operative knee, focus on keeping the knee straight while resting  DIET You may resume your previous home diet once you are discharged from the hospital.  DRESSING / WOUND CARE / SHOWERING Keep your bulky bandage on for 2 days. On the third post-operative day you may remove the Ace bandage and gauze. There is a waterproof adhesive bandage on your skin which will stay in place until your first follow-up appointment. Once you remove this you will not need to place another bandage You may begin showering 3 days following surgery, but do not submerge the incision under water.  ACTIVITY For the first 5 days, the key is rest and control of pain and swelling Do your home exercises twice a day starting on post-operative day 3. On the days you go to physical therapy, just do the home exercises once that day. You should rest, ice and elevate the leg for 50 minutes out of every hour. Get up and walk/stretch for 10 minutes per hour. After 5 days you can increase your activity slowly as tolerated. Walk with your walker as instructed. Use the walker until you are comfortable transitioning to a cane. Walk with the cane in the opposite hand of the operative leg. You may discontinue the cane once you are comfortable and walking steadily. Avoid periods of inactivity such as sitting longer than an hour when not asleep. This helps prevent blood clots.  You may discontinue the knee immobilizer once you are able to perform a straight leg raise while lying down. You may resume a sexual relationship in   one month or when given the OK by your doctor.  You may return to work once you are cleared by your doctor.  Do not drive a car for 6 weeks or until released by your surgeon.  Do not drive while taking narcotics.  TED HOSE STOCKINGS Wear the elastic stockings on both legs for three weeks following surgery during the day. You may remove them at night for sleeping.  WEIGHT  BEARING Weight bearing as tolerated with assist device (walker, cane, etc) as directed, use it as long as suggested by your surgeon or therapist, typically at least 4-6 weeks.  POSTOPERATIVE CONSTIPATION PROTOCOL Constipation - defined medically as fewer than three stools per week and severe constipation as less than one stool per week.  One of the most common issues patients have following surgery is constipation.  Even if you have a regular bowel pattern at home, your normal regimen is likely to be disrupted due to multiple reasons following surgery.  Combination of anesthesia, postoperative narcotics, change in appetite and fluid intake all can affect your bowels.  In order to avoid complications following surgery, here are some recommendations in order to help you during your recovery period.  Colace (docusate) - Pick up an over-the-counter form of Colace or another stool softener and take twice a day as long as you are requiring postoperative pain medications.  Take with a full glass of water daily.  If you experience loose stools or diarrhea, hold the colace until you stool forms back up. If your symptoms do not get better within 1 week or if they get worse, check with your doctor. Dulcolax (bisacodyl) - Pick up over-the-counter and take as directed by the product packaging as needed to assist with the movement of your bowels.  Take with a full glass of water.  Use this product as needed if not relieved by Colace only.  MiraLax (polyethylene glycol) - Pick up over-the-counter to have on hand. MiraLax is a solution that will increase the amount of water in your bowels to assist with bowel movements.  Take as directed and can mix with a glass of water, juice, soda, coffee, or tea. Take if you go more than two days without a movement. Do not use MiraLax more than once per day. Call your doctor if you are still constipated or irregular after using this medication for 7 days in a row.  If you continue  to have problems with postoperative constipation, please contact the office for further assistance and recommendations.  If you experience "the worst abdominal pain ever" or develop nausea or vomiting, please contact the office immediatly for further recommendations for treatment.  ITCHING If you experience itching with your medications, try taking only a single pain pill, or even half a pain pill at a time.  You can also use Benadryl over the counter for itching or also to help with sleep.   MEDICATIONS See your medication summary on the "After Visit Summary" that the nursing staff will review with you prior to discharge.  You may have some home medications which will be placed on hold until you complete the course of blood thinner medication.  It is important for you to complete the blood thinner medication as prescribed by your surgeon.  Continue your approved medications as instructed at time of discharge.  PRECAUTIONS If you experience chest pain or shortness of breath - call 911 immediately for transfer to the hospital emergency department.  If you develop a fever greater that   101 F, purulent drainage from wound, increased redness or drainage from wound, foul odor from the wound/dressing, or calf pain - CONTACT YOUR SURGEON.                                                   FOLLOW-UP APPOINTMENTS Make sure you keep all of your appointments after your operation with your surgeon and caregivers. You should call the office at the above phone number and make an appointment for approximately two weeks after the date of your surgery or on the date instructed by your surgeon outlined in the "After Visit Summary".  RANGE OF MOTION AND STRENGTHENING EXERCISES  Rehabilitation of the knee is important following a knee injury or an operation. After just a few days of immobilization, the muscles of the thigh which control the knee become weakened and shrink (atrophy). Knee exercises are designed to build up  the tone and strength of the thigh muscles and to improve knee motion. Often times heat used for twenty to thirty minutes before working out will loosen up your tissues and help with improving the range of motion but do not use heat for the first two weeks following surgery. These exercises can be done on a training (exercise) mat, on the floor, on a table or on a bed. Use what ever works the best and is most comfortable for you Knee exercises include:  Leg Lifts - While your knee is still immobilized in a splint or cast, you can do straight leg raises. Lift the leg to 60 degrees, hold for 3 sec, and slowly lower the leg. Repeat 10-20 times 2-3 times daily. Perform this exercise against resistance later as your knee gets better.  Quad and Hamstring Sets - Tighten up the muscle on the front of the thigh (Quad) and hold for 5-10 sec. Repeat this 10-20 times hourly. Hamstring sets are done by pushing the foot backward against an object and holding for 5-10 sec. Repeat as with quad sets.  Leg Slides: Lying on your back, slowly slide your foot toward your buttocks, bending your knee up off the floor (only go as far as is comfortable). Then slowly slide your foot back down until your leg is flat on the floor again. Angel Wings: Lying on your back spread your legs to the side as far apart as you can without causing discomfort.  A rehabilitation program following serious knee injuries can speed recovery and prevent re-injury in the future due to weakened muscles. Contact your doctor or a physical therapist for more information on knee rehabilitation.   POST-OPERATIVE OPIOID TAPER INSTRUCTIONS: It is important to wean off of your opioid medication as soon as possible. If you do not need pain medication after your surgery it is ok to stop day one. Opioids include: Codeine, Hydrocodone(Norco, Vicodin), Oxycodone(Percocet, oxycontin) and hydromorphone amongst others.  Long term and even short term use of opiods can  cause: Increased pain response Dependence Constipation Depression Respiratory depression And more.  Withdrawal symptoms can include Flu like symptoms Nausea, vomiting And more Techniques to manage these symptoms Hydrate well Eat regular healthy meals Stay active Use relaxation techniques(deep breathing, meditating, yoga) Do Not substitute Alcohol to help with tapering If you have been on opioids for less than two weeks and do not have pain than it is ok to stop all together.    Plan to wean off of opioids This plan should start within one week post op of your joint replacement. Maintain the same interval or time between taking each dose and first decrease the dose.  Cut the total daily intake of opioids by one tablet each day Next start to increase the time between doses. The last dose that should be eliminated is the evening dose.   IF YOU ARE TRANSFERRED TO A SKILLED REHAB FACILITY If the patient is transferred to a skilled rehab facility following release from the hospital, a list of the current medications will be sent to the facility for the patient to continue.  When discharged from the skilled rehab facility, please have the facility set up the patient's Home Health Physical Therapy prior to being released. Also, the skilled facility will be responsible for providing the patient with their medications at time of release from the facility to include their pain medication, the muscle relaxants, and their blood thinner medication. If the patient is still at the rehab facility at time of the two week follow up appointment, the skilled rehab facility will also need to assist the patient in arranging follow up appointment in our office and any transportation needs.  MAKE SURE YOU:  Understand these instructions.  Get help right away if you are not doing well or get worse.   DENTAL ANTIBIOTICS:  In most cases prophylactic antibiotics for Dental procdeures after total joint surgery are  not necessary.  Exceptions are as follows:  1. History of prior total joint infection  2. Severely immunocompromised (Organ Transplant, cancer chemotherapy, Rheumatoid biologic meds such as Humera)  3. Poorly controlled diabetes (A1C &gt; 8.0, blood glucose over 200)  If you have one of these conditions, contact your surgeon for an antibiotic prescription, prior to your dental procedure.    Pick up stool softner and laxative for home use following surgery while on pain medications. Do not submerge incision under water. Please use good hand washing techniques while changing dressing each day. May shower starting three days after surgery. Please use a clean towel to pat the incision dry following showers. Continue to use ice for pain and swelling after surgery. Do not use any lotions or creams on the incision until instructed by your surgeon.  

## 2022-11-06 NOTE — Progress Notes (Signed)
Orthopedic Tech Progress Note Patient Details:  Charles Lara October 30, 1960 [id number]  Patient ID: Charles Lara, male   DOB: 1961-01-30, 62 y.o.   MRN: [id number]  Charles Lara 11/06/2022, 4:22 PM Cpm applied to left leg in pacu

## 2022-11-06 NOTE — Interval H&P Note (Signed)
History and Physical Interval Note:  11/06/2022 12:12 PM  Charles Lara  has presented today for surgery, with the diagnosis of Failed left knee unicompartmental arthroplasty.  The various methods of treatment have been discussed with the patient and family. After consideration of risks, benefits and other options for treatment, the patient has consented to  Procedure(s): Revision left knee unicompartmental arthroplasty to total knee arthroplasty (Left) as a surgical intervention.  The patient's history has been reviewed, patient examined, no change in status, stable for surgery.  I have reviewed the patient's chart and labs.  Questions were answered to the patient's satisfaction.     Pilar Plate Adonai Selsor

## 2022-11-06 NOTE — Anesthesia Postprocedure Evaluation (Signed)
Anesthesia Post Note  Patient: Charles Lara  Procedure(s) Performed: Revision left knee unicompartmental arthroplasty to total knee arthroplasty (Left: Knee)     Patient location during evaluation: PACU Anesthesia Type: General Level of consciousness: awake and sedated Pain management: pain level controlled Vital Signs Assessment: post-procedure vital signs reviewed and stable Respiratory status: spontaneous breathing Cardiovascular status: stable Postop Assessment: no apparent nausea or vomiting Anesthetic complications: no  No notable events documented.  Last Vitals:  Vitals:   11/06/22 1600 11/06/22 1615  BP: 139/73 130/72  Pulse: 75 76  Resp: 17 12  Temp: 37.1 C   SpO2: 98% 94%    Last Pain:  Vitals:   11/06/22 1621  TempSrc:   PainSc: Asleep                 Huston Foley

## 2022-11-06 NOTE — Brief Op Note (Signed)
11/06/2022  3:44 PM  PATIENT:  Elliot Gurney  62 y.o. male  PRE-OPERATIVE DIAGNOSIS:  Failed left knee unicompartmental arthroplasty  POST-OPERATIVE DIAGNOSIS:  Failed left knee unicompartmental arthroplasty  PROCEDURE:  Procedure(s): Revision left knee unicompartmental arthroplasty to total knee arthroplasty (Left)  SURGEON:  Surgeon(s) and Role:    Gaynelle Arabian, MD - Primary  PHYSICIAN ASSISTANT:   ASSISTANTS: Jaynie Bream, PA-C   ANESTHESIA:   regional and general  EBL:  50 mL   BLOOD ADMINISTERED:none  DRAINS: none   LOCAL MEDICATIONS USED:  OTHER Exparel  COUNTS:  YES  TOURNIQUET:   Total Tourniquet Time Documented: Thigh (Left) - 66 minutes Total: Thigh (Left) - 66 minutes   DICTATION: .Other Dictation: Dictation Number [id number]  PLAN OF CARE: Admit to inpatient   PATIENT DISPOSITION:  PACU - hemodynamically stable.

## 2022-11-06 NOTE — Transfer of Care (Signed)
Immediate Anesthesia Transfer of Care Note  Patient: Charles Lara  Procedure(s) Performed: Revision left knee unicompartmental arthroplasty to total knee arthroplasty (Left: Knee)  Patient Location: PACU  Anesthesia Type:Regional and GA combined with regional for post-op pain  Level of Consciousness: awake, alert , oriented, and patient cooperative  Airway & Oxygen Therapy: Patient Spontanous Breathing and Patient connected to face mask oxygen  Post-op Assessment: Report given to RN and Post -op Vital signs reviewed and stable  Post vital signs: Reviewed and stable  Last Vitals:  Vitals Value Taken Time  BP 139/73 11/06/22 1600  Temp    Pulse 74 11/06/22 1602  Resp 18 11/06/22 1602  SpO2 99 % 11/06/22 1602  Vitals shown include unvalidated device data.  Last Pain:  Vitals:   11/06/22 1325  TempSrc:   PainSc: 0-No pain      Patients Stated Pain Goal: 4 ([id number])  Complications: No notable events documented.

## 2022-11-06 NOTE — Progress Notes (Signed)
Orthopedic Tech Progress Note Patient Details:  Charles Lara [birth year] [id number]  Patient ID: Tayton Antill, male   DOB: 28-Feb-1961, 62 y.o.   MRN: [id number]  Kennis Carina 11/06/2022, 6:25 PM Cpm removed. Patient unable to tolerate.

## 2022-11-06 NOTE — Progress Notes (Signed)
PT Cancellation Note  Patient Details Name: Charles Lara MRN: [id number] DOB: [birth year]   Cancelled Treatment:      Pt declined PT eval due to elevated pain response of 8-9/10 with nurse providing pt with pain medication when PT in the room.  Pt in bed with CPM in place. Pt indicates B LE abn sensation at baseline and amb with L foot brace donned and brother to bring to hospital no earlier than 11am. PT anticipates improved participation with skilled PT assessment 3/7. Pt left in bed all needs met and in place.   Baird Lyons, PT   Adair Patter 11/06/2022, 6:41 PM

## 2022-11-06 NOTE — Op Note (Unsigned)
NAMEEZZARD, CARESS MEDICAL RECORD NO: [id number] ACCOUNT NO: [id number] DATE OF BIRTH: May 09, 1961 FACILITY: [facility] LOCATION: [id number] PHYSICIAN: Dione Plover. Cirilo Canner, MD  Operative Report   DATE OF PROCEDURE: 11/06/2022  PREOPERATIVE DIAGNOSIS:  Failed left knee unicompartmental arthroplasty.  POSTOPERATIVE DIAGNOSIS:  Failed left knee unicompartmental arthroplasty.  PROCEDURE:  Revision of left knee unicompartmental arthroplasty to total knee arthroplasty.  SURGEON:  Dione Plover. Persephone Schriever, MD  ASSISTANT:  Jaynie Bream, PA-C.  ANESTHESIA:  General and adductor canal block.  ESTIMATED BLOOD LOSS:  50.  DRAINS:  None.  TOURNIQUET TIME:  69 minutes at 300 mmHg.  COMPLICATIONS:  None.  CONDITION:  Stable to recovery.  BRIEF CLINICAL NOTE:  Charles Lara is a 62 year old male with a severe left knee pain.  He has got a remote history of a unicompartmental arthroplasty, which is tilted and loosened on the medial side.  He also has developed bone on bone changes on the lateral  side.  He presents now for revision of the unicompartmental replacement to a total knee arthroplasty.  DESCRIPTION OF PROCEDURE:  After successful administration of adductor canal block then general anesthetic, a tourniquet was placed high on his left thigh and his left lower extremity was prepped and draped in the usual sterile fashion.  Extremity was  wrapped in Esmarch, tourniquet inflated to 300 mmHg.  Midline incision was made with a 10 blade through subcutaneous tissue to the level of the extensor mechanism.  A fresh blade was used to make a medial parapatellar arthrotomy.  Soft tissue on the  proximal medial tibia subperiosteally elevated to the joint line with a knife and into the semimembranosus bursa with a Cobb elevator.  Soft tissue laterally was elevated with attention being paid to avoiding the patellar tendon on tibial tubercle.   Removed some scar tissue and I was able to evert the patella and flex the knee 90  degrees.  The medial unicompartmental replacement was grossly tilted on the femoral side and on the tibial side was subsided significantly into the tibia and tilted towards  the center of the tibia.  He also had bone on bone changes that had developed in the lateral compartment.  I removed his ACL and PCL.  I then used an osteotome to disrupt the interface between the femoral component medially in the bone.  This was easily  removed.  Drill was then used to create a starting hole in the distal femur and canal was thoroughly irrigated.  A 5-degree left valgus alignment guide was placed and the block pinned to remove 10 mm off the distal femur.  Distal femoral resection was  made with an oscillating saw.  The tibia subluxed forward and the lateral meniscal remnants were removed.  I then used an osteotome to disrupt the interface between the tibial component medially and the bone.  This was also removed and left a large defect in the medial tibial plateau.   Circumferential retraction was placed around the tibia, which was subluxed forward.  The extramedullary tibial alignment guide was placed referencing proximally at the medial aspect of the tibial tubercle and distally along the second metatarsal axis  and tibial crest.  Block is pinned to remove about 2 mm off the deficient lateral side.  He still had a large defect medially.  Resection was made with an oscillating saw, and size 8 is the most appropriate tibial component.  The defect was about 7-8 mm  deep.  I put a 10 mm augment along the cut surface  that was on the lateral side just to see how deep I needed to cut medially to get it leveled with a 10 mm augment.  I marked this out on the tibia and then did the resection with an oscillating saw.  We  then prepared the proximal tibia for the size 8 tibial component with the modular drill and keel punch.  I reamed to 14 mm for placement of the 14 mm cemented stem.  We then sized for a cement restrictor,  which was a size 5 and placed at appropriate  depth in the tibial canal.  I also prepared for a 29 mm sleeve with the 29 broach.  We then addressed the femur.  Femoral sizing guide was placed, size 7 was most appropriate.  Rotation is marked off the epicondylar axis and the block pinned in this rotation.  Anterior, posterior and chamfer cuts were made.  Intercondylar block was then  placed and the intercondylar cut made.  The trial 7 posterior stabilized Attune femur was placed.  On the tibial side, the trial is a size 8 Attune revision tibia with a 10 mm augment medially.  A 29 sleeve and a 14 x 30 stem extension.  The trials were  placed.  A 7 mm insert was placed, which allowed for full extension with excellent varus, valgus, and anterior, posterior balance throughout full range of motion.  Patella was then everted.  Thickness was measured to be 27 mm.  Freehand resection was  taken down to 15 mm.  41 template was placed, lug holes were drilled, trial patella placed and it tracks normally.  The osteophytes were removed off the posterior femur with the femoral trial in place.  All trials were removed and cut bone surfaces  prepared with pulsatile lavage.  Cement is mixed and once ready for implantation it is first injected into the tibial canal and then placed on the cut bone surface of the tibia.  Tibial component was impacted and all extruded cement removed.  On the  femoral side the size 7 femur was placed and impacted and all extruded cement removed.  To the 41 patella was cemented into place and held with a clamp and all extruded cement removed.  The 7 mm insert was placed.  Knee held in full extension.  Again,  extruded cement was removed.  A 20 mL of Exparel mixed with 60 mL of saline was injected into the extensor mechanism, posterior capsule and periosteum of the femur.  When the cement was fully hardened, then the permanent 7 mm posterior stabilized  rotating platform insert is placed into the  tibial tray.  Wound was copiously irrigated with saline solution and the arthrotomy closed with a running 0 Stratafix suture.  Flexion against gravity was 135 degrees.  Tourniquet was released, total time 69  minutes.  Subcutaneous was closed with interrupted 2-0 Vicryl and subcuticular running 4-0 Monocryl.  Incisions cleaned and dried and Steri-Strips and sterile dressing applied.  He was then awakened and transported to recovery in stable condition.  Note that a surgical assistant was of medical necessity for this procedure to do it in a safe and expeditious manner.  Assistant was necessary for retraction of vital ligaments and neurovascular structures and for proper positioning of the limb for safe  removal of the old implant and safe and accurate placement of the new implant.   SHY D: 11/06/2022 3:52:57 pm T: 11/06/2022 8:54:00 pm  JOB: [id number]

## 2022-11-06 NOTE — Anesthesia Procedure Notes (Signed)
Procedure Name: Intubation Date/Time: 11/06/2022 1:49 PM  Performed by: Victoriano Lain, CRNAPre-anesthesia Checklist: Patient identified, Emergency Drugs available, Suction available, Patient being monitored and Timeout performed Patient Re-evaluated:Patient Re-evaluated prior to induction Oxygen Delivery Method: Circle system utilized Preoxygenation: Pre-oxygenation with 100% oxygen Induction Type: IV induction Ventilation: Mask ventilation without difficulty and Oral airway inserted - appropriate to patient size Laryngoscope Size: Mac and 4 Grade View: Grade I Tube type: Oral Tube size: 7.5 mm Number of attempts: 1 Airway Equipment and Method: Stylet Placement Confirmation: ETT inserted through vocal cords under direct vision, positive ETCO2 and breath sounds checked- equal and bilateral Secured at: 22 cm Tube secured with: Tape Dental Injury: Teeth and Oropharynx as per pre-operative assessment

## 2022-11-06 NOTE — Plan of Care (Signed)
  Problem: Education: Goal: Knowledge of the prescribed therapeutic regimen will improve Outcome: Progressing   Problem: Activity: Goal: Ability to avoid complications of mobility impairment will improve Outcome: Progressing   Problem: Pain Management: Goal: Pain level will decrease with appropriate interventions Outcome: Progressing   Problem: Education: Goal: Knowledge of General Education information will improve Description: Including pain rating scale, medication(s)/side effects and non-pharmacologic comfort measures Outcome: Progressing   Problem: Activity: Goal: Risk for activity intolerance will decrease Outcome: Progressing   Problem: Nutrition: Goal: Adequate nutrition will be maintained Outcome: Progressing   Problem: Pain Managment: Goal: General experience of comfort will improve Outcome: Progressing

## 2022-11-06 NOTE — Anesthesia Preprocedure Evaluation (Signed)
Anesthesia Evaluation  Patient identified by MRN, date of birth, ID band Patient awake    Reviewed: Allergy & Precautions, NPO status , Patient's Chart, lab work & pertinent test results, reviewed documented beta blocker date and time   Airway Mallampati: II  TM Distance: >3 FB Neck ROM: Full    Dental  (+) Dental Advisory Given   Pulmonary shortness of breath and with exertion, Current Smoker   Pulmonary exam normal breath sounds clear to auscultation       Cardiovascular hypertension, Normal cardiovascular exam Rhythm:Regular Rate:Normal  Not currently on any HTN Rx   Neuro/Psych  PSYCHIATRIC DISORDERS Anxiety Depression    ADDnegative neurological ROS     GI/Hepatic Neg liver ROS,GERD  Medicated and Controlled,,  Endo/Other  negative endocrine ROS    Renal/GU Renal diseaseHx/o renal calculi  negative genitourinary   Musculoskeletal  (+) Arthritis , Osteoarthritis,  Failed right total knee arthroplasty Post laminectomy syndrome Chronic low back pain   Abdominal  (+) + obese  Peds  Hematology   Anesthesia Other Findings   Reproductive/Obstetrics ED                             Anesthesia Physical Anesthesia Plan  ASA: 2  Anesthesia Plan: General   Post-op Pain Management: Regional block*, Precedex, Tylenol PO (pre-op)*, Ketamine IV*, Dilaudid IV, Ofirmev IV (intra-op)* and Toradol IV (intra-op)*   Induction: Intravenous  PONV Risk Score and Plan: 2 and Treatment may vary due to age or medical condition, Ondansetron, Dexamethasone and Midazolam  Airway Management Planned: Oral ETT  Additional Equipment: None  Intra-op Plan:   Post-operative Plan: Extubation in OR  Informed Consent: I have reviewed the patients History and Physical, chart, labs and discussed the procedure including the risks, benefits and alternatives for the proposed anesthesia with the patient or authorized  representative who has indicated his/her understanding and acceptance.     Dental advisory given  Plan Discussed with: Anesthesiologist and CRNA  Anesthesia Plan Comments: (See PAT note 11/15/2021, Konrad Felix Ward, PA-C)        Anesthesia Quick Evaluation

## 2022-11-06 NOTE — Anesthesia Procedure Notes (Signed)
Anesthesia Regional Block: Adductor canal block   Pre-Anesthetic Checklist: , timeout performed,  Correct Patient, Correct Site, Correct Laterality,  Correct Procedure, Correct Position, site marked,  Risks and benefits discussed,  Surgical consent,  Pre-op evaluation,  At surgeon's request and post-op pain management  Laterality: Lower and Left  Prep: chloraprep       Needles:  Injection technique: Single-shot  Needle Type: Echogenic Needle     Needle Length: 9cm  Needle Gauge: 20   Needle insertion depth: 3 cm   Additional Needles:   Procedures:,,,, ultrasound used (permanent image in chart),,    Narrative:  Start time: 11/06/2022 1:20 PM End time: 11/06/2022 1:27 PM Injection made incrementally with aspirations every 5 mL.  Performed by: Personally  Anesthesiologist: Lyn Hollingshead, MD

## 2022-11-07 ENCOUNTER — Encounter (HOSPITAL_COMMUNITY): Payer: Self-pay | Admitting: Orthopedic Surgery

## 2022-11-07 LAB — CBC
HCT: 34.9 % — ABNORMAL LOW (ref 39.0–52.0)
Hemoglobin: 10.4 g/dL — ABNORMAL LOW (ref 13.0–17.0)
MCH: 28.2 pg (ref 26.0–34.0)
MCHC: 29.8 g/dL — ABNORMAL LOW (ref 30.0–36.0)
MCV: 94.6 fL (ref 80.0–100.0)
Platelets: 250 10*3/uL (ref 150–400)
RBC: 3.69 MIL/uL — ABNORMAL LOW (ref 4.22–5.81)
RDW: 18.9 % — ABNORMAL HIGH (ref 11.5–15.5)
WBC: 12.1 10*3/uL — ABNORMAL HIGH (ref 4.0–10.5)
nRBC: 0 % (ref 0.0–0.2)

## 2022-11-07 LAB — BASIC METABOLIC PANEL
Anion gap: 7 (ref 5–15)
BUN: 34 mg/dL — ABNORMAL HIGH (ref 8–23)
CO2: 28 mmol/L (ref 22–32)
Calcium: 8.5 mg/dL — ABNORMAL LOW (ref 8.9–10.3)
Chloride: 105 mmol/L (ref 98–111)
Creatinine, Ser: 1.44 mg/dL — ABNORMAL HIGH (ref 0.61–1.24)
GFR, Estimated: 55 mL/min — ABNORMAL LOW (ref >60)
Glucose, Bld: 222 mg/dL — ABNORMAL HIGH (ref 70–99)
Potassium: 4.5 mmol/L (ref 3.5–5.1)
Sodium: 140 mmol/L (ref 135–145)

## 2022-11-07 MED ORDER — GUAIFENESIN ER 600 MG PO TB12
600.0000 mg | ORAL_TABLET | Freq: Two times a day (BID) | ORAL | Status: DC | PRN
  Administered 2022-11-07: 600 mg via ORAL
  Filled 2022-11-07: qty 1

## 2022-11-07 MED ORDER — PANTOPRAZOLE SODIUM 20 MG PO TBEC: 20.0000 mg | DELAYED_RELEASE_TABLET | Freq: Every day | ORAL | Status: DC | PRN

## 2022-11-07 MED ORDER — CALCIUM CARBONATE ANTACID 500 MG PO CHEW
1.0000 | CHEWABLE_TABLET | Freq: Every day | ORAL | Status: DC | PRN
  Administered 2022-11-07: 200 mg via ORAL
  Filled 2022-11-07: qty 1

## 2022-11-07 MED ORDER — NICOTINE 14 MG/24HR TD PT24
14.0000 mg | MEDICATED_PATCH | Freq: Every day | TRANSDERMAL | Status: DC
  Administered 2022-11-07 – 2022-11-09 (×3): 14 mg via TRANSDERMAL
  Filled 2022-11-07 (×3): qty 1

## 2022-11-07 NOTE — Evaluation (Signed)
Physical Therapy Evaluation Patient Details Name: Charles Lara MRN: [id number] DOB: March 23, 1961 Today's Date: 11/07/2022  History of Present Illness  Pt is a 62 year old male s/p Revision of left knee unicompartmental arthroplasty to total knee arthroplasty on 11/06/22.  PMHx: R TKA revision, bil UKR, multiple back surgeries, Left foot drop  Clinical Impression  Pt is s/p TKA resulting in the deficits listed below (see PT Problem List).  Pt will benefit from skilled PT to increase their independence and safety with mobility to allow discharge to the venue listed below.   Pt requiring some assist to don custom shoewear.  Pt reports his planned person assist upon d/c is not longer happening so he will need to be fairly independent.  Pt has steps to enter mobile home and reports RW barely fits through his doorways.         Recommendations for follow up therapy are one component of a multi-disciplinary discharge planning process, led by the attending physician.  Recommendations may be updated based on patient status, additional functional criteria and insurance authorization.  Follow Up Recommendations Follow physician's recommendations for discharge plan and follow up therapies      Assistance Recommended at Discharge PRN  Patient can return home with the following  A little help with bathing/dressing/bathroom;Help with stairs or ramp for entrance    Equipment Recommendations None recommended by PT  Recommendations for Other Services       Functional Status Assessment Patient has had a recent decline in their functional status and demonstrates the ability to make significant improvements in function in a reasonable and predictable amount of time.     Precautions / Restrictions Precautions Precautions: Fall;Knee Precaution Comments: prefers to wear his shoes, Left foot/ankle custom orthotic Restrictions Weight Bearing Restrictions: No Other Position/Activity Restrictions: WBAT       Mobility  Bed Mobility Overal bed mobility: Modified Independent                  Transfers Overall transfer level: Needs assistance Equipment used: Rolling walker (2 wheels) Transfers: Sit to/from Stand Sit to Stand: Min guard           General transfer comment: cues for UE and LE positioning    Ambulation/Gait Ambulation/Gait assistance: Min guard Gait Distance (Feet): 160 Feet Assistive device: Rolling walker (2 wheels) Gait Pattern/deviations: Step-to pattern, Decreased stance time - left, Antalgic       General Gait Details: verbal cues for sequence, RW positioning; increased reliance on UE support through [employer] Mobility    Modified Rankin (Stroke Patients Only)       Balance                                             Pertinent Vitals/Pain Pain Assessment Pain Assessment: 0-10 Pain Score: 8  Pain Location: left knee Pain Descriptors / Indicators: Sore, Grimacing, Guarding Pain Intervention(s): Repositioned, Monitored during session    Home Living Family/patient expects to be discharged to:: Private residence Living Arrangements: Alone Available Help at Discharge: Available PRN/intermittently;Family Type of Home: Mobile home Home Access: Stairs to enter Entrance Stairs-Rails: Right Entrance Stairs-Number of Steps: 2+3   Home Layout: One level Home Equipment: Conservation officer, nature (2 wheels);Cane - single point      Prior Function Prior Level of Function :  Independent/Modified Independent                     Hand Dominance        Extremity/Trunk Assessment        Lower Extremity Assessment Lower Extremity Assessment: LLE deficits/detail;RLE deficits/detail RLE Deficits / Details: right ankle deformity, increased foot inversion LLE Deficits / Details: left custom foot/ankle orthotic/prosthesis for inversion and foot drop, left knee AAROM flexion 90*       Communication    Communication: No difficulties  Cognition Arousal/Alertness: Awake/alert Behavior During Therapy: WFL for tasks assessed/performed Overall Cognitive Status: Within Functional Limits for tasks assessed                                          General Comments      Exercises     Assessment/Plan    PT Assessment Patient needs continued PT services  PT Problem List Decreased strength;Decreased activity tolerance;Decreased mobility;Decreased range of motion;Decreased balance;Decreased knowledge of use of DME;Pain       PT Treatment Interventions Stair training;DME instruction;Gait training;Balance training;Therapeutic exercise;Therapeutic activities;Patient/family education;Functional mobility training    PT Goals (Current goals can be found in the Care Plan section)  Acute Rehab PT Goals PT Goal Formulation: With patient Time For Goal Achievement: 11/21/22 Potential to Achieve Goals: Good    Frequency 7X/week     Co-evaluation               AM-PAC PT "6 Clicks" Mobility  Outcome Measure Help needed turning from your back to your side while in a flat bed without using bedrails?: A Little Help needed moving from lying on your back to sitting on the side of a flat bed without using bedrails?: A Little Help needed moving to and from a bed to a chair (including a wheelchair)?: A Little Help needed standing up from a chair using your arms (e.g., wheelchair or bedside chair)?: A Little Help needed to walk in hospital room?: A Lot Help needed climbing 3-5 steps with a railing? : A Lot 6 Click Score: 16    End of Session Equipment Utilized During Treatment: Gait belt Activity Tolerance: Patient tolerated treatment well Patient left: in bed;with call bell/phone within reach (pt agrees not to get OOB without assist)   PT Visit Diagnosis: Other abnormalities of gait and mobility (R26.89)    Time: 1110-1135 PT Time Calculation (min) (ACUTE ONLY): 25  min   Charges:   PT Evaluation $PT Eval Low Complexity: 1 Low PT Treatments $Gait Training: 8-22 mins      Jannette Spanner PT, DPT Physical Therapist Acute Rehabilitation Services Preferred contact method: Secure Chat Weekend Pager Only: [phone] Office: [phone]   [name] 11/07/2022, 1:52 PM

## 2022-11-07 NOTE — TOC Initial Note (Signed)
Transition of Care [facility]) - Initial/Assessment Note   Patient Details  Name: Charles Lara MRN: [id number] Date of Birth: 02/03/1961  Transition of Care [facility]) CM/SW Contact:    Sherie Don, LCSW Phone Number: 11/07/2022, 2:37 PM  Clinical Narrative: Patient is expected to discharge home after working with PT. CSW met with patient to confirm discharge plan and needs. Patient has a rolling walker, so there are no DME needs at this time. Patient reported his ride for OPPT fell through and is requesting HHPT. CSW notified ortho PA and she is agreeable to [place] making [facility] referrals. CSW made referrals to the following agencies:  [facility]: declined [facility]: declined [facility]: declined [facility]: declined [facility]: declined [facility]: declined as patient does not have any out-of-network benefits [facility]: declined [facility]: awaiting response              Expected Discharge Plan: [place] Barriers to Discharge: No [place] will accept this patient  Patient Goals and CMS Choice Patient states their goals for this hospitalization and ongoing recovery are:: Discharge home with [place] CMS Medicare.gov Compare Post Acute Care list provided to:: Patient Choice offered to / list presented to : Patient  Expected Discharge Plan and Services In-house Referral: Clinical Social Work Post Acute Care Choice: [place] arrangements for the past 2 months: [place]             DME Arranged: N/A DME Agency: NA  Prior Living Arrangements/Services Living arrangements for the past 2 months: Single Family Home Patient language and need for interpreter reviewed:: Yes Do you feel safe going back to the place where you live?: Yes      Need for Family Participation in Patient Care: Yes (Comment) Care giver support system in place?: Yes (comment) Current home services: DME (Has rolling walker) Criminal Activity/Legal Involvement Pertinent to Current  Situation/Hospitalization: No - Comment as needed  Activities of Daily Living Home Assistive Devices/Equipment: Cane (specify quad or straight), Walker (specify type), Wheelchair, Bedside commode/3-in-1 ADL Screening (condition at time of admission) Patient's cognitive ability adequate to safely complete daily activities?: Yes Is the patient deaf or have difficulty hearing?: Yes Does the patient have difficulty seeing, even when wearing glasses/contacts?: Yes Does the patient have difficulty concentrating, remembering, or making decisions?: Yes Patient able to express need for assistance with ADLs?: Yes Does the patient have difficulty dressing or bathing?: No Independently performs ADLs?: Yes (appropriate for developmental age) Does the patient have difficulty walking or climbing stairs?: Yes Weakness of Legs: Both Weakness of Arms/Hands: Both  Permission Sought/Granted Permission sought to share information with : Other (comment) Permission granted to share information with : Yes, Verbal Permission Granted Permission granted to share info w AGENCY: [place] agencies  Emotional Assessment Appearance:: Appears stated age Attitude/Demeanor/Rapport: Engaged Affect (typically observed): Accepting Orientation: : Oriented to Self, Oriented to Place, Oriented to  Time, Oriented to Situation Alcohol / Substance Use: Not Applicable Psych Involvement: No (comment)  Admission diagnosis:  Failed arthroplasty, initial encounter [facility]) [T84.019A] Patient Active Problem List   Diagnosis Date Noted   Failed arthroplasty, initial encounter ([place]) 11/06/2022   Failed total knee arthroplasty ([place]) 11/28/2021   Failed total right knee replacement ([place]) 11/28/2021   Generalized hyperhidrosis-seen by Endo in past 07/14/2018   ED (erectile dysfunction) 07/14/2018   Testosterone deficiency in male 07/14/2018   Attention deficit disorder 07/14/2018   h/o High blood pressure 07/14/2018   Kidney stones-has  urologist 07/14/2018  Depression 07/14/2018   Tobacco abuse counseling 07/14/2018   Tobacco use disorder-  proximally 45-pack-year history, current smoker 07/14/2018   Chronic pain 11/22/2013   GERD (gastroesophageal reflux disease) 11/22/2013   Hyperlipidemia 11/22/2013   Chronic pain disorder 02/21/2012   Postlaminectomy syndrome, lumbar region 02/21/2012   Pain due to unicompartmental arthroplasty of knee ([place]) 02/21/2012   PCP:  Juline Patch, MD Pharmacy:   [facility], [place] [address] Phone: [phone] Fax: [phone]  Social Determinants of Health (SDOH) Social History: [place]: No Food Insecurity (11/06/2022)  Housing: Low Risk  (11/06/2022)  Transportation Needs: No Transportation Needs (11/06/2022)  Utilities: Not At Risk (11/06/2022)  Depression (PHQ2-9): Low Risk  (06/27/2019)  Tobacco Use: High Risk (11/06/2022)   SDOH Interventions:    Readmission Risk Interventions    11/29/2021    2:52 PM  Readmission Risk Prevention Plan  Post Dischage Appt Complete  Medication Screening Complete

## 2022-11-07 NOTE — Progress Notes (Signed)
   Subjective: 1 Day Post-Op Procedure(s) (LRB): Revision left knee unicompartmental arthroplasty to total knee arthroplasty (Left) Patient reports pain as moderate.   Patient seen in rounds by Dr. Wynelle Link. Patient is well, and has had no acute complaints or problems  We will continue therapy today. Unable to participate in PT last night after surgery due to pain.  Objective: Vital signs in last 24 hours: Temp:  [97.9 F (36.6 C)-98.8 F (37.1 C)] 98.3 F (36.8 C) (03/07 0143) Pulse Rate:  [68-85] 75 (03/07 0143) Resp:  [10-21] 17 (03/07 0143) BP: (117-139)/(65-84) 127/66 (03/07 0143) SpO2:  [93 %-99 %] 93 % (03/07 0143) Weight:  [93 kg] 93 kg (03/06 1700)  Intake/Output from previous day:  Intake/Output Summary (Last 24 hours) at 11/07/2022 0746 Last data filed at 11/07/2022 0134 Gross per 24 hour  Intake 2425 ml  Output 1000 ml  Net 1425 ml     Intake/Output this shift: No intake/output data recorded.  Labs: Recent Labs    11/07/22 0355  HGB 10.4*   Recent Labs    11/07/22 0355  WBC 12.1*  RBC 3.69*  HCT 34.9*  PLT 250   Recent Labs    11/07/22 0355  NA 140  K 4.5  CL 105  CO2 28  BUN 34*  CREATININE 1.44*  GLUCOSE 222*  CALCIUM 8.5*   No results for input(s): "LABPT", "INR" in the last 72 hours.  Exam: General - Patient is Alert and Oriented Extremity - Neurovascular intact Dorsiflexion/Plantar flexion intact Calves soft and nontender Dressing - dressing C/D/I Motor Function - intact, moving foot and toes well on exam.   Past Medical History:  Diagnosis Date   Anemia    Anxiety    Arthritis    Attention deficit disorder    Complication of anesthesia    40 years ago had surgery where could not get breath right as receiving preop meds no problekms since   Depression    Diaphoresis    Dyspnea    with exertion   GERD (gastroesophageal reflux disease)    Heart murmur    High blood pressure    not on meds x 6 month sor longer or a year    High cholesterol    History of kidney stones     Assessment/Plan: 1 Day Post-Op Procedure(s) (LRB): Revision left knee unicompartmental arthroplasty to total knee arthroplasty (Left) Principal Problem:   Pain due to unicompartmental arthroplasty of knee (HCC) Active Problems:   Failed arthroplasty, initial encounter ([place])  Estimated body mass index is 29.42 kg/m as calculated from the following:   Height as of this encounter: '5\' 10"'$  (1.778 m).   Weight as of this encounter: 93 kg.  Anticipated LOS equal to or greater than 2 midnights due to - Age 63 and older with one or more of the following:  - Obesity  - Expected need for hospital services (PT, OT, Nursing) required for safe  discharge  - Anticipated need for postoperative skilled nursing care or inpatient rehab  - Active co-morbidities: None OR   - Unanticipated findings during/Post Surgery: Slow post-op progression: GI, pain control, mobility  - Patient is a high risk of re-admission due to: None   Up with therapy today.  DVT Prophylaxis - Aspirin Weight bearing as tolerated.  Plan is to go Home after hospital stay. Expect d/c home tomorrow if progressing appropriately with PT and meeting goals.   Shearon Balo, PA-C Orthopedic Surgery 11/07/2022, 7:46 AM

## 2022-11-07 NOTE — Progress Notes (Signed)
Physical Therapy Treatment Patient Details Name: Charles Lara MRN: [id number] DOB: 09-21-1960 Today's Date: 11/07/2022   History of Present Illness Pt is a 62 year old male s/p Revision of left knee unicompartmental arthroplasty to total knee arthroplasty on 11/06/22.  PMHx: R TKA revision, bil UKR, multiple back surgeries, Left foot drop    PT Comments    Pt had assist from rehab tech with donning shoes/orthotics/prosthetics prior to PT arrival.  RN gave pain meds beginning of session.  Pt ambulated in hallway and practiced safe stair technique.  Pt also performed a couple exercises at edge of bed.  Pt reminded to keep Left knee straight while at rest.  Provided HEP handout and verbally reviewed with pt.  Pt uncertain about pain meds/management with d/c and also does report tobacco use so RN informed.    Recommendations for follow up therapy are one component of a multi-disciplinary discharge planning process, led by the attending physician.  Recommendations may be updated based on patient status, additional functional criteria and insurance authorization.  Follow Up Recommendations  Follow physician's recommendations for discharge plan and follow up therapies     Assistance Recommended at Discharge PRN  Patient can return home with the following A little help with bathing/dressing/bathroom;Help with stairs or ramp for entrance   Equipment Recommendations  None recommended by PT    Recommendations for Other Services       Precautions / Restrictions Precautions Precautions: Fall;Knee Precaution Comments: prefers to wear his shoes, Left foot/ankle custom orthotic Restrictions Weight Bearing Restrictions: No Other Position/Activity Restrictions: WBAT     Mobility  Bed Mobility Overal bed mobility: Modified Independent                  Transfers Overall transfer level: Needs assistance Equipment used: Rolling walker (2 wheels) Transfers: Sit to/from Stand Sit to Stand:  Min guard, From elevated surface           General transfer comment: cues for UE and LE positioning    Ambulation/Gait Ambulation/Gait assistance: Min guard Gait Distance (Feet): 120 Feet Assistive device: Rolling walker (2 wheels) Gait Pattern/deviations: Step-to pattern, Decreased stance time - left, Antalgic       General Gait Details: verbal cues for sequence, RW positioning; increased reliance on UE support through RW   Stairs Stairs: Yes Stairs assistance: Min guard Stair Management: Step to pattern, Forwards, One rail Left, With cane Number of Stairs: 2 General stair comments: verbal cues for sequence and safety, no physical assist required, pt reports understanding   Wheelchair Mobility    Modified Rankin (Stroke Patients Only)       Balance                                            Cognition Arousal/Alertness: Awake/alert Behavior During Therapy: WFL for tasks assessed/performed Overall Cognitive Status: Within Functional Limits for tasks assessed                                          Exercises Total Joint Exercises Quad Sets: AROM, Left, 10 reps, Seated Heel Slides: Seated, Left, 10 reps, AAROM    General Comments        Pertinent Vitals/Pain Pain Assessment Pain Assessment: 0-10 Pain Score: 8  Pain Location: left  knee Pain Descriptors / Indicators: Sore, Grimacing, Guarding Pain Intervention(s): RN gave pain meds during session, Monitored during session, Repositioned    Home Living                          Prior Function            PT Goals (current goals can now be found in the care plan section) Acute Rehab PT Goals PT Goal Formulation: With patient Time For Goal Achievement: 11/21/22 Potential to Achieve Goals: Good Progress towards PT goals: Progressing toward goals    Frequency    7X/week      PT Plan Current plan remains appropriate    Co-evaluation               AM-PAC PT "6 Clicks" Mobility   Outcome Measure  Help needed turning from your back to your side while in a flat bed without using bedrails?: A Little Help needed moving from lying on your back to sitting on the side of a flat bed without using bedrails?: A Little Help needed moving to and from a bed to a chair (including a wheelchair)?: A Little Help needed standing up from a chair using your arms (e.g., wheelchair or bedside chair)?: A Little Help needed to walk in hospital room?: A Little Help needed climbing 3-5 steps with a railing? : A Little 6 Click Score: 18    End of Session Equipment Utilized During Treatment: Gait belt Activity Tolerance: Patient tolerated treatment well Patient left: in bed;with call bell/phone within reach (pt agrees not to get OOB without assist) Nurse Communication: Mobility status PT Visit Diagnosis: Other abnormalities of gait and mobility (R26.89)     Time: [id number] PT Time Calculation (min) (ACUTE ONLY): 28 min  Charges:  $Gait Training: 23-37 mins                    Jannette Spanner PT, DPT Physical Therapist Acute Rehabilitation Services Preferred contact method: Secure Chat Weekend Pager Only: [phone] Office: [place] 11/07/2022, 3:59 PM

## 2022-11-08 LAB — CBC
HCT: 24.9 % — ABNORMAL LOW (ref 39.0–52.0)
HCT: 23.3 % — ABNORMAL LOW (ref 39.0–52.0)
Hemoglobin: 7.6 g/dL — ABNORMAL LOW (ref 13.0–17.0)
Hemoglobin: 7.1 g/dL — ABNORMAL LOW (ref 13.0–17.0)
MCH: 28.5 pg (ref 26.0–34.0)
MCH: 28.5 pg (ref 26.0–34.0)
MCHC: 30.5 g/dL (ref 30.0–36.0)
MCHC: 30.5 g/dL (ref 30.0–36.0)
MCV: 93.3 fL (ref 80.0–100.0)
MCV: 93.6 fL (ref 80.0–100.0)
Platelets: 210 10*3/uL (ref 150–400)
Platelets: 229 10*3/uL (ref 150–400)
RBC: 2.67 MIL/uL — ABNORMAL LOW (ref 4.22–5.81)
RBC: 2.49 MIL/uL — ABNORMAL LOW (ref 4.22–5.81)
RDW: 19.1 % — ABNORMAL HIGH (ref 11.5–15.5)
RDW: 19.1 % — ABNORMAL HIGH (ref 11.5–15.5)
WBC: 11.1 10*3/uL — ABNORMAL HIGH (ref 4.0–10.5)
WBC: 10.8 10*3/uL — ABNORMAL HIGH (ref 4.0–10.5)
nRBC: 0 % (ref 0.0–0.2)
nRBC: 0 % (ref 0.0–0.2)

## 2022-11-08 LAB — PREPARE RBC (CROSSMATCH)

## 2022-11-08 MED ORDER — SODIUM CHLORIDE 0.9% IV SOLUTION: Freq: Once | INTRAVENOUS | Status: AC

## 2022-11-08 MED ORDER — ASPIRIN 81 MG PO CHEW: CHEWABLE_TABLET | ORAL | 0 refills | Status: AC

## 2022-11-08 MED ORDER — BACLOFEN 10 MG PO TABS: 10.0000 mg | ORAL_TABLET | Freq: Four times a day (QID) | ORAL | 1 refills | Status: AC | PRN

## 2022-11-08 MED ORDER — HYDROMORPHONE HCL 2 MG PO TABS: 2.0000 mg | ORAL_TABLET | Freq: Four times a day (QID) | ORAL | 0 refills | Status: AC | PRN

## 2022-11-08 NOTE — Progress Notes (Addendum)
   Subjective: 2 Days Post-Op Procedure(s) (LRB): Revision left knee unicompartmental arthroplasty to total knee arthroplasty (Left) Patient seen in rounds by Dr. Wynelle Link. Patient is well, and has had no acute complaints or problems. Denies SOB or chest pain. Denies lightheadedness or dizziness. Denies calf pain. Patient reports pain as moderate.  Worked with physical therapy yesterday and ambulated 120'.  Objective: Vital signs in last 24 hours: Temp:  [97.7 F (36.5 C)-98.8 F (37.1 C)] 98.8 F (37.1 C) (03/08 0540) Pulse Rate:  [74-76] 74 (03/08 0540) Resp:  [17-18] 17 (03/08 0540) BP: (105-139)/(61-64) 139/63 (03/08 0540) SpO2:  [94 %-97 %] 97 % (03/08 0540)  Intake/Output from previous day:  Intake/Output Summary (Last 24 hours) at 11/08/2022 0750 Last data filed at 11/08/2022 0629 Gross per 24 hour  Intake 231.14 ml  Output 3725 ml  Net -3493.86 ml    Intake/Output this shift: No intake/output data recorded.  Labs: Recent Labs    11/07/22 0355 11/08/22 0402  HGB 10.4* 7.6*   Recent Labs    11/07/22 0355 11/08/22 0402  WBC 12.1* 11.1*  RBC 3.69* 2.67*  HCT 34.9* 24.9*  PLT 250 210   Recent Labs    11/07/22 0355  NA 140  K 4.5  CL 105  CO2 28  BUN 34*  CREATININE 1.44*  GLUCOSE 222*  CALCIUM 8.5*   No results for input(s): "LABPT", "INR" in the last 72 hours.  Exam: General - Patient is Alert and Oriented Extremity - Neurologically intact Neurovascular intact Sensation intact distally Dorsiflexion/Plantar flexion intact Dressing/Incision - clean, dry, no drainage Motor Function - intact, moving foot and toes well on exam.  Past Medical History:  Diagnosis Date   Anemia    Anxiety    Arthritis    Attention deficit disorder    Complication of anesthesia    40 years ago had surgery where could not get breath right as receiving preop meds no problekms since   Depression    Diaphoresis    Dyspnea    with exertion   GERD (gastroesophageal  reflux disease)    Heart murmur    High blood pressure    not on meds x 6 month sor longer or a year   High cholesterol    History of kidney stones     Assessment/Plan: 2 Days Post-Op Procedure(s) (LRB): Revision left knee unicompartmental arthroplasty to total knee arthroplasty (Left) Principal Problem:   Pain due to unicompartmental arthroplasty of knee (HCC) Active Problems:   Failed arthroplasty, initial encounter ([place])  Estimated body mass index is 29.42 kg/m as calculated from the following:   Height as of this encounter: 5\' 10"  (1.778 m).   Weight as of this encounter: 93 kg.  DVT Prophylaxis - Aspirin Weight-bearing as tolerated.  Hgb dropped to 7.6. Asymptomatic. Will repeat CBC later today and monitor.  Continue physical therapy. Expected discharge home today. Hoping to have HHPT but discussed if unable, will do HEP. Follow-up in clinic in 2 weeks.  The PDMP database was reviewed today prior to any opioid medications being prescribed to this patient.  R. Jaynie Bream, PA-C Orthopedic Surgery [phone] 11/08/2022, 7:50 AM

## 2022-11-08 NOTE — TOC Transition Note (Signed)
Transition of Care [facility]) - CM/SW Discharge Note  Patient Details  Name: Charles Lara MRN: [id number] Date of Birth: [birth year]  Transition of Care [facility]) CM/SW Contact:  Sherie Don, LCSW Phone Number: 11/08/2022, 10:55 AM  Clinical Narrative: Referrals for HHPT were declined by [facility] and [facility]. CSW updated patient and ortho PA. Patient will discharge home with a home exercise program (HEP) since no [facility] agency would accept the referral. TOC signing off.   Final next level of care: Home/Self Care Barriers to Discharge: No [place] will accept this patient  Patient Goals and CMS Choice CMS Medicare.gov Compare Post Acute Care list provided to:: Patient Choice offered to / list presented to : Patient  Discharge Plan and Services Additional resources added to the After Visit Summary for   In-house Referral: Clinical Social Work Post Acute Care Choice: Home Health          DME Arranged: N/A DME Agency: NA  Social Determinants of Health (SDOH) Interventions SDOH Screenings   Food Insecurity: No Food Insecurity (11/06/2022)  Housing: Low Risk  (11/06/2022)  Transportation Needs: No Transportation Needs (11/06/2022)  Utilities: Not At Risk (11/06/2022)  Depression (PHQ2-9): Low Risk  (06/27/2019)  Tobacco Use: High Risk (11/07/2022)   Readmission Risk Interventions    11/29/2021    2:52 PM  Readmission Risk Prevention Plan  Post Dischage Appt Complete  Medication Screening Complete

## 2022-11-08 NOTE — Progress Notes (Signed)
Rechecked hgb. Decreased from 7.6 to 7.1 Patient with reported dizziness when getting up to work with physical therapy - though this did resolve fairly quickly. Discussed with Dr. Wynelle Link. Will go ahead and transfuse 2 units PRBCs. He will stay additional night. Recheck CBC in AM.

## 2022-11-08 NOTE — Progress Notes (Signed)
Physical Therapy Treatment Patient Details Name: Charles Lara MRN: [id number] DOB: 1961-01-16 Today's Date: 11/08/2022   History of Present Illness Pt is a 62 year old male s/p Revision of left knee unicompartmental arthroplasty to total knee arthroplasty on 11/06/22.  PMHx: R TKA revision, bil UKR, multiple back surgeries, Left foot drop    PT Comments    Pt provided with orthotics sitting EOB and having difficulty donning.  Pt requested therapist step away for him to be able to perform on his own time, so therapist left room and returned.  Pt required assist for donning left shoe however.  Requested pt ask his brother or another person to assist him with dressing in the morning at least upon d/c home as pt cannot perform his donning in his usual manner due to left knee pain s/p TKA revision.  Pt ambulated short distance in hallway and limited by 9/10 pain.  Pt requested to be left in bathroom and agreeable to use pull cord when finished Control and instrumentation engineer also notified).  Will need to return to review HEP as pt not able to have HHPT upon d/c.    Recommendations for follow up therapy are one component of a multi-disciplinary discharge planning process, led by the attending physician.  Recommendations may be updated based on patient status, additional functional criteria and insurance authorization.  Follow Up Recommendations  Follow physician's recommendations for discharge plan and follow up therapies     Assistance Recommended at Discharge PRN  Patient can return home with the following A little help with bathing/dressing/bathroom;Help with stairs or ramp for entrance   Equipment Recommendations  None recommended by PT    Recommendations for Other Services       Precautions / Restrictions Precautions Precautions: Fall;Knee Precaution Comments: prefers to wear his shoes, Left foot/ankle custom orthotic Restrictions Other Position/Activity Restrictions: WBAT     Mobility  Bed  Mobility Overal bed mobility: Modified Independent                  Transfers Overall transfer level: Needs assistance Equipment used: Rolling walker (2 wheels) Transfers: Sit to/from Stand Sit to Stand: Min guard           General transfer comment: cues for UE and LE positioning    Ambulation/Gait Ambulation/Gait assistance: Min guard Gait Distance (Feet): 60 Feet Assistive device: Rolling walker (2 wheels) Gait Pattern/deviations: Step-to pattern, Decreased stance time - left, Antalgic       General Gait Details: verbal cues for sequence, RW positioning; increased reliance on UE support through RW; pt reports 9/10 pain today limiting distance   Stairs             Wheelchair Mobility    Modified Rankin (Stroke Patients Only)       Balance                                            Cognition Arousal/Alertness: Awake/alert Behavior During Therapy: WFL for tasks assessed/performed Overall Cognitive Status: Within Functional Limits for tasks assessed                                          Exercises      General Comments        Pertinent Vitals/Pain Pain Assessment Pain  Assessment: 0-10 Pain Score: 9  Pain Location: left knee and thigh Pain Descriptors / Indicators: Sore, Grimacing, Guarding, Tender, Tightness Pain Intervention(s): Repositioned, Monitored during session, Patient requesting pain meds-RN notified    Home Living                          Prior Function            PT Goals (current goals can now be found in the care plan section) Progress towards PT goals: Progressing toward goals    Frequency    7X/week      PT Plan Current plan remains appropriate    Co-evaluation              AM-PAC PT "6 Clicks" Mobility   Outcome Measure  Help needed turning from your back to your side while in a flat bed without using bedrails?: A Little Help needed moving from lying  on your back to sitting on the side of a flat bed without using bedrails?: A Little Help needed moving to and from a bed to a chair (including a wheelchair)?: A Little Help needed standing up from a chair using your arms (e.g., wheelchair or bedside chair)?: A Little Help needed to walk in hospital room?: A Little Help needed climbing 3-5 steps with a railing? : A Little 6 Click Score: 18    End of Session Equipment Utilized During Treatment: Gait belt Activity Tolerance: Patient tolerated treatment well Patient left: Other (comment) (in bathroom, agreeable to use pull cord for assist when finished) Nurse Communication: Mobility status;Patient requests pain meds PT Visit Diagnosis: Other abnormalities of gait and mobility (R26.89)    Time: [id number] Time: [id number] PT Time Calculation (min) (ACUTE ONLY): 14 min  Charges:  $Gait Training: 8-22 mins                    Arlyce Dice, DPT Physical Therapist Acute Rehabilitation Services Preferred contact method: Secure Chat Weekend Pager Only: [phone] Office: [place] 11/08/2022, 12:24 PM

## 2022-11-08 NOTE — Progress Notes (Signed)
Physical Therapy Treatment Patient Details Name: Charles Lara MRN: [id number] DOB: [birth year] Today's Date: 11/08/2022   History of Present Illness Pt is a 62 year old male s/p Revision of left knee unicompartmental arthroplasty to total knee arthroplasty on 11/06/22.  PMHx: R TKA revision, bil UKR, multiple back surgeries, Left foot drop    PT Comments    Pt sleeping on arrival.  Pt to receive PRBCs (not yet started) so deferred OOB at this time.  Pt performed LE exercises in supine and then set up for lunch (in room on arrival however pt sleeping and had not eaten).    Recommendations for follow up therapy are one component of a multi-disciplinary discharge planning process, led by the attending physician.  Recommendations may be updated based on patient status, additional functional criteria and insurance authorization.  Follow Up Recommendations  Follow physician's recommendations for discharge plan and follow up therapies     Assistance Recommended at Discharge PRN  Patient can return home with the following A little help with bathing/dressing/bathroom;Help with stairs or ramp for entrance   Equipment Recommendations  None recommended by PT    Recommendations for Other Services       Precautions / Restrictions Precautions Precautions: Fall;Knee Precaution Comments: prefers to wear his shoes, Left foot/ankle custom orthotic Restrictions Other Position/Activity Restrictions: WBAT     Mobility  Bed Mobility    Stairs             Wheelchair Mobility    Modified Rankin (Stroke Patients Only)       Balance                                            Cognition Arousal/Alertness: Awake/alert Behavior During Therapy: WFL for tasks assessed/performed Overall Cognitive Status: Within Functional Limits for tasks assessed                                          Exercises Total Joint Exercises Ankle Circles/Pumps: 5 reps,  Supine, Both, Limitations Ankle Circles/Pumps Limitations: history of ankle AROM limitations prior to surgery, encouraged any movement possible Quad Sets: AROM, Left, 10 reps Short Arc Quad: AAROM, Left, 10 reps Heel Slides: AAROM, Left, 10 reps Hip ABduction/ADduction: AAROM, Left, 10 reps Straight Leg Raises: AAROM, Left, 10 reps    General Comments        Pertinent Vitals/Pain Pain Assessment Pain Assessment: 0-10 Pain Score: 9  Pain Location: left knee and thigh Pain Descriptors / Indicators: Sore, Grimacing, Guarding, Tender, Tightness Pain Intervention(s): Repositioned, Monitored during session, Patient requesting pain meds-RN notified    Home Living                          Prior Function            PT Goals (current goals can now be found in the care plan section) Progress towards PT goals: Progressing toward goals    Frequency    7X/week      PT Plan Current plan remains appropriate    Co-evaluation              AM-PAC PT "6 Clicks" Mobility   Outcome Measure  Help needed turning from your back to your side while in  a flat bed without using bedrails?: A Little Help needed moving from lying on your back to sitting on the side of a flat bed without using bedrails?: A Little Help needed moving to and from a bed to a chair (including a wheelchair)?: A Little Help needed standing up from a chair using your arms (e.g., wheelchair or bedside chair)?: A Little Help needed to walk in hospital room?: A Little Help needed climbing 3-5 steps with a railing? : A Little 6 Click Score: 18    End of Session Equipment Utilized During Treatment: Gait belt Activity Tolerance: Patient limited by pain Patient left: in bed;with call bell/phone within reach;with bed alarm set Nurse Communication: Mobility status PT Visit Diagnosis: Other abnormalities of gait and mobility (R26.89)     Time: [id number] PT Time Calculation (min) (ACUTE ONLY): 13  min  Charges:   $Therapeutic Exercise: 8-22 mins                    Jannette Spanner PT, DPT Physical Therapist Acute Rehabilitation Services Preferred contact method: Secure Chat Weekend Pager Only: [phone] Office: [place] 11/08/2022, 3:30 PM

## 2022-11-09 LAB — CBC
HCT: 25.5 % — ABNORMAL LOW (ref 39.0–52.0)
HCT: 29.1 % — ABNORMAL LOW (ref 39.0–52.0)
Hemoglobin: 8.2 g/dL — ABNORMAL LOW (ref 13.0–17.0)
Hemoglobin: 9.1 g/dL — ABNORMAL LOW (ref 13.0–17.0)
MCH: 28.8 pg (ref 26.0–34.0)
MCH: 28.5 pg (ref 26.0–34.0)
MCHC: 32.2 g/dL (ref 30.0–36.0)
MCHC: 31.3 g/dL (ref 30.0–36.0)
MCV: 89.5 fL (ref 80.0–100.0)
MCV: 91.2 fL (ref 80.0–100.0)
Platelets: 212 10*3/uL (ref 150–400)
Platelets: 237 10*3/uL (ref 150–400)
RBC: 2.85 MIL/uL — ABNORMAL LOW (ref 4.22–5.81)
RBC: 3.19 MIL/uL — ABNORMAL LOW (ref 4.22–5.81)
RDW: 19.9 % — ABNORMAL HIGH (ref 11.5–15.5)
RDW: 19.8 % — ABNORMAL HIGH (ref 11.5–15.5)
WBC: 11 10*3/uL — ABNORMAL HIGH (ref 4.0–10.5)
WBC: 10.9 10*3/uL — ABNORMAL HIGH (ref 4.0–10.5)
nRBC: 0 % (ref 0.0–0.2)
nRBC: 0 % (ref 0.0–0.2)

## 2022-11-09 NOTE — Discharge Summary (Signed)
Physician Discharge Summary   Patient ID: Charles Lara MRN: [id number] DOB/AGE: 63-Mar-1962 63 y.o.  Admit date: 11/06/2022 Discharge date: 11/09/2022  Primary Diagnosis: Failed left knee unicompartmental arthroplasty.    Admission Diagnoses:  Past Medical History:  Diagnosis Date   Anemia    Anxiety    Arthritis    Attention deficit disorder    Complication of anesthesia    40 years ago had surgery where could not get breath right as receiving preop meds no problekms since   Depression    Diaphoresis    Dyspnea    with exertion   GERD (gastroesophageal reflux disease)    Heart murmur    High blood pressure    not on meds x 6 month sor longer or a year   High cholesterol    History of kidney stones    Discharge Diagnoses:   Principal Problem:   Pain due to unicompartmental arthroplasty of knee (HCC) Active Problems:   Failed arthroplasty, initial encounter ([place])  Estimated body mass index is 29.42 kg/m as calculated from the following:   Height as of this encounter: '5\' 10"'$  (1.778 m).   Weight as of this encounter: 93 kg.  Procedure:  Procedure(s) (LRB): Revision left knee unicompartmental arthroplasty to total knee arthroplasty (Left)   Consults: None  HPI: Charles Lara is a 62 year old male with a severe left knee pain.  He has got a remote history of a unicompartmental arthroplasty, which is tilted and loosened on the medial side.  He also has developed bone on bone changes on the lateral side.  He presents now for revision of the unicompartmental replacement to a total knee arthroplasty.   Laboratory Data: Admission on 11/06/2022, Discharged on 11/09/2022  Component Date Value Ref Range Status   WBC 11/07/2022 12.1 (H)  4.0 - 10.5 K/uL Final   RBC 11/07/2022 3.69 (L)  4.22 - 5.81 MIL/uL Final   Hemoglobin 11/07/2022 10.4 (L)  13.0 - 17.0 g/dL Final   HCT 11/07/2022 34.9 (L)  39.0 - 52.0 % Final   MCV 11/07/2022 94.6  80.0 - 100.0 fL Final   MCH 11/07/2022 28.2   26.0 - 34.0 pg Final   MCHC 11/07/2022 29.8 (L)  30.0 - 36.0 g/dL Final   RDW 11/07/2022 18.9 (H)  11.5 - 15.5 % Final   Platelets 11/07/2022 250  150 - 400 K/uL Final   nRBC 11/07/2022 0.0  0.0 - 0.2 % Final   Performed at [facility], [place] [address] [place], [place] [address]   Sodium 11/07/2022 140  135 - 145 mmol/L Final   Potassium 11/07/2022 4.5  3.5 - 5.1 mmol/L Final   Chloride 11/07/2022 105  98 - 111 mmol/L Final   CO2 11/07/2022 28  22 - 32 mmol/L Final   Glucose, Bld 11/07/2022 222 (H)  70 - 99 mg/dL Final   Glucose reference range applies only to samples taken after fasting for at least 8 hours.   BUN 11/07/2022 34 (H)  8 - 23 mg/dL Final   Creatinine, Ser 11/07/2022 1.44 (H)  0.61 - 1.24 mg/dL Final   Calcium 11/07/2022 8.5 (L)  8.9 - 10.3 mg/dL Final   GFR, Estimated 11/07/2022 55 (L)  >60 mL/min Final   Comment: (NOTE) Calculated using the CKD-EPI Creatinine Equation (2021)    Anion gap 11/07/2022 7  5 - 15 Final   Performed at [facility], [place] [address], [place], [place] [address]   WBC 11/08/2022 11.1 (  H)  4.0 - 10.5 K/uL Final   RBC 11/08/2022 2.67 (L)  4.22 - 5.81 MIL/uL Final   Hemoglobin 11/08/2022 7.6 (L)  13.0 - 17.0 g/dL Final   REPEATED TO VERIFY   HCT 11/08/2022 24.9 (L)  39.0 - 52.0 % Final   MCV 11/08/2022 93.3  80.0 - 100.0 fL Final   MCH 11/08/2022 28.5  26.0 - 34.0 pg Final   MCHC 11/08/2022 30.5  30.0 - 36.0 g/dL Final   RDW 11/08/2022 19.1 (H)  11.5 - 15.5 % Final   Platelets 11/08/2022 210  150 - 400 K/uL Final   nRBC 11/08/2022 0.0  0.0 - 0.2 % Final   Performed at [facility], [place] [address] [place], [place] [address]   WBC 11/08/2022 10.8 (H)  4.0 - 10.5 K/uL Final   RBC 11/08/2022 2.49 (L)  4.22 - 5.81 MIL/uL Final   Hemoglobin 11/08/2022 7.1 (L)  13.0 - 17.0 g/dL Final   HCT 11/08/2022 23.3 (L)  39.0 - 52.0 % Final   MCV 11/08/2022 93.6  80.0 - 100.0 fL Final   MCH 11/08/2022  28.5  26.0 - 34.0 pg Final   MCHC 11/08/2022 30.5  30.0 - 36.0 g/dL Final   RDW 11/08/2022 19.1 (H)  11.5 - 15.5 % Final   Platelets 11/08/2022 229  150 - 400 K/uL Final   nRBC 11/08/2022 0.0  0.0 - 0.2 % Final   Performed at [facility], [place] [address] [place], [place] [address]   ABO/RH(D) 11/08/2022 O NEG   Final   Antibody Screen 11/08/2022 NEG   Final   Sample Expiration 11/08/2022 11/11/2022,2359   Final   Unit Number 11/08/2022 [id number]   Final   Blood Component Type 11/08/2022 RBC LR PHER2   Final   Unit division 11/08/2022 00   Final   Status of Unit 11/08/2022 ISSUED   Final   Transfusion Status 11/08/2022 OK TO TRANSFUSE   Final   Crossmatch Result 11/08/2022    Final                   Value:Compatible Performed at [facility], [place], [place] [address]    Unit Number 11/08/2022 [id number]   Final   Blood Component Type 11/08/2022 RED CELLS,LR   Final   Unit division 11/08/2022 00   Final   Status of Unit 11/08/2022 ISSUED   Final   Transfusion Status 11/08/2022 OK TO TRANSFUSE   Final   Crossmatch Result 11/08/2022 Compatible   Final   Order Confirmation 11/08/2022    Final                   Value:ORDER PROCESSED BY BLOOD BANK Performed at [facility], [place] [address] [place], [place] [address]    ISSUE DATE / TIME 11/08/2022 [id number]   Final   Blood Product Unit Number 11/08/2022 [id number]   Final   PRODUCT CODE 11/08/2022 [id number]   Final   Unit Type and Rh 11/08/2022 9500   Final   Blood Product Expiration Date 11/08/2022 [id number]   Final   ISSUE DATE / TIME 11/08/2022 [id number]   Final   Blood Product Unit Number 11/08/2022 [id number]   Final   PRODUCT CODE 11/08/2022 [id number]   Final   Unit Type and Rh 11/08/2022 9500   Final   Blood Product Expiration Date 11/08/2022 [id number]   Final   WBC 11/09/2022 11.0 (H)  4.0 -  10.5 K/uL Final   RBC 11/09/2022 2.85  (L)  4.22 - 5.81 MIL/uL Final   Hemoglobin 11/09/2022 8.2 (L)  13.0 - 17.0 g/dL Final   HCT 11/09/2022 25.5 (L)  39.0 - 52.0 % Final   MCV 11/09/2022 89.5  80.0 - 100.0 fL Final   MCH 11/09/2022 28.8  26.0 - 34.0 pg Final   MCHC 11/09/2022 32.2  30.0 - 36.0 g/dL Final   RDW 11/09/2022 19.9 (H)  11.5 - 15.5 % Final   Platelets 11/09/2022 212  150 - 400 K/uL Final   nRBC 11/09/2022 0.0  0.0 - 0.2 % Final   Performed at [facility], [place] [address], [place], [place] [address]   Order Confirmation 11/08/2022    Final                   Value:BB SAMPLE OR UNITS ALREADY AVAILABLE Performed at [place] [address], [place], [place] [address]    WBC 11/09/2022 10.9 (H)  4.0 - 10.5 K/uL Final   RBC 11/09/2022 3.19 (L)  4.22 - 5.81 MIL/uL Final   Hemoglobin 11/09/2022 9.1 (L)  13.0 - 17.0 g/dL Final   HCT 11/09/2022 29.1 (L)  39.0 - 52.0 % Final   MCV 11/09/2022 91.2  80.0 - 100.0 fL Final   MCH 11/09/2022 28.5  26.0 - 34.0 pg Final   MCHC 11/09/2022 31.3  30.0 - 36.0 g/dL Final   RDW 11/09/2022 19.8 (H)  11.5 - 15.5 % Final   Platelets 11/09/2022 237  150 - 400 K/uL Final   nRBC 11/09/2022 0.0  0.0 - 0.2 % Final   Performed at [facility], [place] [address], [place], [place] [address]  Hospital Outpatient Visit on 10/28/2022  Component Date Value Ref Range Status   MRSA, PCR 10/28/2022 NEGATIVE  NEGATIVE Final   Staphylococcus aureus 10/28/2022 NEGATIVE  NEGATIVE Final   Comment: (NOTE) The Xpert SA Assay (FDA approved for NASAL specimens in patients 28 years of age and older), is one component of a comprehensive surveillance program. It is not intended to diagnose infection nor to guide or monitor treatment. Performed at [facility], [place] [address], [place], [place] [address]    WBC 10/28/2022 10.4  4.0 - 10.5 K/uL Final   RBC 10/28/2022 5.28  4.22 - 5.81 MIL/uL Final   Hemoglobin 10/28/2022 14.6  13.0 -  17.0 g/dL Final   HCT 10/28/2022 47.7  39.0 - 52.0 % Final   MCV 10/28/2022 90.3  80.0 - 100.0 fL Final   MCH 10/28/2022 27.7  26.0 - 34.0 pg Final   MCHC 10/28/2022 30.6  30.0 - 36.0 g/dL Final   RDW 10/28/2022 18.1 (H)  11.5 - 15.5 % Final   Platelets 10/28/2022 436 (H)  150 - 400 K/uL Final   nRBC 10/28/2022 0.0  0.0 - 0.2 % Final   Performed at [facility], [place] [address] [place], [place] [address]   Sodium 10/28/2022 140  135 - 145 mmol/L Final   Potassium 10/28/2022 4.2  3.5 - 5.1 mmol/L Final   Chloride 10/28/2022 107  98 - 111 mmol/L Final   CO2 10/28/2022 22  22 - 32 mmol/L Final   Glucose, Bld 10/28/2022 106 (H)  70 - 99 mg/dL Final   Glucose reference range applies only to samples taken after fasting for at least 8 hours.   BUN 10/28/2022 24 (H)  8 - 23 mg/dL Final  Creatinine, Ser 10/28/2022 1.50 (H)  0.61 - 1.24 mg/dL Final   Calcium 10/28/2022 9.2  8.9 - 10.3 mg/dL Final   GFR, Estimated 10/28/2022 52 (L)  >60 mL/min Final   Comment: (NOTE) Calculated using the CKD-EPI Creatinine Equation (2021)    Anion gap 10/28/2022 11  5 - 15 Final   Performed at [facility], [place] [address], [place], [place] [address]     X-Rays:No results found.  EKG: Orders placed or performed during the hospital encounter of 10/28/22   EKG 12 lead per protocol   EKG 12 lead per protocol     Hospital Course: Charles Lara is a 62 y.o. who was admitted to [facility]. They were brought to the operating room on 11/06/2022 and underwent Procedure(s): Revision left knee unicompartmental arthroplasty to total knee arthroplasty.  Patient tolerated the procedure well and was later transferred to the recovery room and then to the orthopaedic floor for postoperative care. They were given PO and IV analgesics for pain control following their surgery. They were given 24 hours of postoperative antibiotics of  Anti-infectives (From admission, onward)     Start     Dose/Rate Route Frequency Ordered Stop   11/06/22 2000  ceFAZolin (ANCEF) IVPB 2g/100 mL premix        2 g 200 mL/hr over 30 Minutes Intravenous Every 6 hours 11/06/22 1659 11/07/22 0202   11/06/22 1200  ceFAZolin (ANCEF) IVPB 2g/100 mL premix        2 g 200 mL/hr over 30 Minutes Intravenous On call to O.R. 11/06/22 1145 11/06/22 1420     and started on DVT prophylaxis in the form of Aspirin.   PT and OT were ordered for total joint protocol. Discharge planning consulted to help with post-op disposition and equipment needs. Patient had a fair night on the evening of surgery. They started to get up OOB with physical therapy on POD #1. Continued to work with physical therapy into POD #3 and was meeting his goals. Hospital stay complicated by decrease in hgb which required transfusion of 2 units of PRBCs on POD #2. Hgb increased and patient no longer symptomatic. He was seen in rounds and discharged home in stable condition.  Diet: Regular diet Activity: WBAT Follow-up: in 2 weeks Disposition: Home Discharged Condition: stable   Discharge Instructions     Call MD / Call 911   Complete by: As directed    If you experience chest pain or shortness of breath, CALL 911 and be transported to the hospital emergency room.  If you develope a fever above 101 F, pus (white drainage) or increased drainage or redness at the wound, or calf pain, call your surgeon's office.   Change dressing   Complete by: As directed    You may remove the bulky bandage (ACE wrap and gauze) two days after surgery. You will have an adhesive waterproof bandage underneath. Leave this in place until your first follow-up appointment.   Constipation Prevention   Complete by: As directed    Drink plenty of fluids.  Prune juice may be helpful.  You may use a stool softener, such as Colace (over the counter) 100 mg twice a day.  Use MiraLax (over the counter) for constipation as needed.   Diet - low sodium heart healthy    Complete by: As directed    Do not put a pillow under the knee. Place it under the heel.   Complete by: As directed  Driving restrictions   Complete by: As directed    No driving for two weeks   Post-operative opioid taper instructions:   Complete by: As directed    POST-OPERATIVE OPIOID TAPER INSTRUCTIONS: It is important to wean off of your opioid medication as soon as possible. If you do not need pain medication after your surgery it is ok to stop day one. Opioids include: Codeine, Hydrocodone(Norco, Vicodin), Oxycodone(Percocet, oxycontin) and hydromorphone amongst others.  Long term and even short term use of opiods can cause: Increased pain response Dependence Constipation Depression Respiratory depression And more.  Withdrawal symptoms can include Flu like symptoms Nausea, vomiting And more Techniques to manage these symptoms Hydrate well Eat regular healthy meals Stay active Use relaxation techniques(deep breathing, meditating, yoga) Do Not substitute Alcohol to help with tapering If you have been on opioids for less than two weeks and do not have pain than it is ok to stop all together.  Plan to wean off of opioids This plan should start within one week post op of your joint replacement. Maintain the same interval or time between taking each dose and first decrease the dose.  Cut the total daily intake of opioids by one tablet each day Next start to increase the time between doses. The last dose that should be eliminated is the evening dose.      TED hose   Complete by: As directed    Use stockings (TED hose) for three weeks on both leg(s).  You may remove them at night for sleeping.   Weight bearing as tolerated   Complete by: As directed       Allergies as of 11/09/2022       Reactions   Poison Ivy Extract Itching, Swelling        Medication List     STOP taking these medications    diclofenac 75 MG EC tablet Commonly known as: VOLTAREN    methocarbamol 500 MG tablet Commonly known as: ROBAXIN       TAKE these medications    amphetamine-dextroamphetamine 20 MG tablet Commonly known as: ADDERALL Take 20 mg by mouth 3 (three) times daily.   aspirin 81 MG chewable tablet Take 81 mg Aspirin two times a day for three weeks following surgery. Then take an 81 mg Aspirin once a day for three weeks. Then discontinue Aspirin.   baclofen 10 MG tablet Commonly known as: LIORESAL Take 1 tablet (10 mg total) by mouth every 6 (six) hours as needed for muscle spasms. What changed:  when to take this reasons to take this   buPROPion 300 MG 24 hr tablet Commonly known as: WELLBUTRIN XL Take 300 mg by mouth daily.   DULoxetine 60 MG capsule Commonly known as: CYMBALTA Take 120 mg by mouth in the morning.   furosemide 20 MG tablet Commonly known as: LASIX Take 20 mg by mouth daily as needed for fluid or edema.   HYDROmorphone 2 MG tablet Commonly known as: DILAUDID Take 1-2 tablets (2-4 mg total) by mouth every 6 (six) hours as needed for severe pain (not controlled by chronic oxycodone).   naloxone 4 MG/0.1ML Liqd nasal spray kit Commonly known as: NARCAN Place 1 spray into the nose once.   omeprazole 20 MG capsule Commonly known as: PRILOSEC Take 20 mg by mouth daily.   oxyCODONE 5 MG immediate release tablet Commonly known as: Oxy IR/ROXICODONE Take 1-2 tablets (5-10 mg total) by mouth every 6 (six) hours as needed for moderate pain or  severe pain. What changed:  how much to take how to take this when to take this   pramipexole 0.5 MG tablet Commonly known as: MIRAPEX Take 0.5 mg by mouth at bedtime.   pregabalin 100 MG capsule Commonly known as: LYRICA Take 100-200 mg by mouth See admin instructions. Take 100 mg in the morning and 200 mg at bedtime   tadalafil 20 MG tablet Commonly known as: CIALIS Take 20 mg by mouth daily as needed for erectile dysfunction.   Testosterone Cypionate 200 MG/ML  Soln Inject 100 mg into the muscle once a week. 0.5 ml   valACYclovir 500 MG tablet Commonly known as: VALTREX Take 500 mg by mouth 2 (two) times daily as needed (gh).   Voltaren 1 % Gel Generic drug: diclofenac Sodium Apply 2 g topically daily as needed (pain).               Discharge Care Instructions  (From admission, onward)           Start     Ordered   11/08/22 0000  Weight bearing as tolerated        11/08/22 0755   11/08/22 0000  Change dressing       Comments: You may remove the bulky bandage (ACE wrap and gauze) two days after surgery. You will have an adhesive waterproof bandage underneath. Leave this in place until your first follow-up appointment.   11/08/22 0755            Follow-up Information     Gaynelle Arabian, MD Follow up in 2 week(s).   Specialty: Orthopedic Surgery Contact information: [address] [place] [address] [phone]                 Signed: R. Jaynie Bream, PA-C Orthopedic Surgery 11/09/2022, 3:09 PM

## 2022-11-09 NOTE — Plan of Care (Signed)
  Problem: Education: Goal: Knowledge of the prescribed therapeutic regimen will improve Outcome: Progressing Goal: Individualized Educational Video(s) Outcome: Progressing   Problem: Activity: Goal: Ability to avoid complications of mobility impairment will improve Outcome: Progressing Goal: Range of joint motion will improve Outcome: Progressing   Problem: Clinical Measurements: Goal: Postoperative complications will be avoided or minimized Outcome: Progressing   Problem: Pain Management: Goal: Pain level will decrease with appropriate interventions Outcome: Progressing   Problem: Education: Goal: Knowledge of General Education information will improve Description: Including pain rating scale, medication(s)/side effects and non-pharmacologic comfort measures Outcome: Progressing   Problem: Health Behavior/Discharge Planning: Goal: Ability to manage health-related needs will improve Outcome: Progressing   Problem: Clinical Measurements: Goal: Ability to maintain clinical measurements within normal limits will improve Outcome: Progressing Goal: Will remain free from infection Outcome: Progressing Goal: Diagnostic test results will improve Outcome: Progressing Goal: Respiratory complications will improve Outcome: Progressing Goal: Cardiovascular complication will be avoided Outcome: Progressing   Problem: Activity: Goal: Risk for activity intolerance will decrease Outcome: Progressing   Problem: Nutrition: Goal: Adequate nutrition will be maintained Outcome: Progressing   Problem: Coping: Goal: Level of anxiety will decrease Outcome: Progressing   Problem: Elimination: Goal: Will not experience complications related to bowel motility Outcome: Progressing Goal: Will not experience complications related to urinary retention Outcome: Progressing   Problem: Pain Managment: Goal: General experience of comfort will improve Outcome: Progressing   Problem:  Safety: Goal: Ability to remain free from injury will improve Outcome: Progressing   

## 2022-11-09 NOTE — Plan of Care (Signed)
  Problem: Education: Goal: Knowledge of the prescribed therapeutic regimen will improve Outcome: Adequate for Discharge Goal: Individualized Educational Video(s) Outcome: Adequate for Discharge   Problem: Activity: Goal: Ability to avoid complications of mobility impairment will improve Outcome: Adequate for Discharge Goal: Range of joint motion will improve Outcome: Adequate for Discharge   Problem: Clinical Measurements: Goal: Postoperative complications will be avoided or minimized Outcome: Adequate for Discharge   Problem: Pain Management: Goal: Pain level will decrease with appropriate interventions Outcome: Adequate for Discharge   Problem: Education: Goal: Knowledge of General Education information will improve Description: Including pain rating scale, medication(s)/side effects and non-pharmacologic comfort measures Outcome: Adequate for Discharge   Problem: Health Behavior/Discharge Planning: Goal: Ability to manage health-related needs will improve Outcome: Adequate for Discharge   Problem: Clinical Measurements: Goal: Ability to maintain clinical measurements within normal limits will improve Outcome: Adequate for Discharge Goal: Will remain free from infection Outcome: Adequate for Discharge Goal: Diagnostic test results will improve Outcome: Adequate for Discharge Goal: Respiratory complications will improve Outcome: Adequate for Discharge Goal: Cardiovascular complication will be avoided Outcome: Adequate for Discharge   Problem: Activity: Goal: Risk for activity intolerance will decrease Outcome: Adequate for Discharge   Problem: Nutrition: Goal: Adequate nutrition will be maintained Outcome: Adequate for Discharge   Problem: Coping: Goal: Level of anxiety will decrease Outcome: Adequate for Discharge   Problem: Elimination: Goal: Will not experience complications related to bowel motility Outcome: Adequate for Discharge Goal: Will not  experience complications related to urinary retention Outcome: Adequate for Discharge   Problem: Pain Managment: Goal: General experience of comfort will improve Outcome: Adequate for Discharge   Problem: Safety: Goal: Ability to remain free from injury will improve Outcome: Adequate for Discharge   Problem: Acute Rehab PT Goals(only PT should resolve) Goal: Pt Will Go Supine/Side To Sit Outcome: Adequate for Discharge Goal: Patient Will Transfer Sit To/From Stand Outcome: Adequate for Discharge Goal: Pt Will Ambulate Outcome: Adequate for Discharge Goal: Pt Will Go Up/Down Stairs Outcome: Adequate for Discharge  Discharged home with friend.  Discharge teaching including written information.

## 2022-11-09 NOTE — Progress Notes (Signed)
   Subjective: 3 Days Post-Op Procedure(s) (LRB): Revision left knee unicompartmental arthroplasty to total knee arthroplasty (Left) Patient seen in rounds for Dr. Wynelle Link. Patient is  well. Had mild dizziness yesterday when getting up with physical therapy. Denies dizziness or lightheadedness this AM. Denies SOB or chest pain.  He does voice he's concerned his hiatal hernia may be contributing to his blood loss as he feels "fuller" and with less of an appetite. Denies blood in stool. Patient reports pain as  mild to moderate .   Objective: Vital signs in last 24 hours: Temp:  [97.9 F (36.6 C)-98.8 F (37.1 C)] 98.5 F (36.9 C) (03/09 0649) Pulse Rate:  [61-79] 72 (03/09 0649) Resp:  [14-19] 16 (03/09 0649) BP: (108-125)/(58-65) 122/64 (03/09 0649) SpO2:  [93 %-95 %] 93 % (03/09 0649)  Intake/Output from previous day:  Intake/Output Summary (Last 24 hours) at 11/09/2022 0859 Last data filed at 11/09/2022 0648 Gross per 24 hour  Intake 1939.68 ml  Output 2250 ml  Net -310.32 ml    Intake/Output this shift: No intake/output data recorded.  Labs: Recent Labs    11/07/22 0355 11/08/22 0402 11/08/22 1038 11/09/22 0339  HGB 10.4* 7.6* 7.1* 8.2*   Recent Labs    11/08/22 1038 11/09/22 0339  WBC 10.8* 11.0*  RBC 2.49* 2.85*  HCT 23.3* 25.5*  PLT 229 212   Recent Labs    11/07/22 0355  NA 140  K 4.5  CL 105  CO2 28  BUN 34*  CREATININE 1.44*  GLUCOSE 222*  CALCIUM 8.5*   No results for input(s): "LABPT", "INR" in the last 72 hours.  Exam: General - Patient is Alert and Oriented Extremity - Neurologically intact Neurovascular intact Sensation intact distally Dorsiflexion/Plantar flexion intact Dressing/Incision - clean, dry, no drainage Motor Function - intact, moving foot and toes well on exam.  Past Medical History:  Diagnosis Date   Anemia    Anxiety    Arthritis    Attention deficit disorder    Complication of anesthesia    40 years ago had  surgery where could not get breath right as receiving preop meds no problekms since   Depression    Diaphoresis    Dyspnea    with exertion   GERD (gastroesophageal reflux disease)    Heart murmur    High blood pressure    not on meds x 6 month sor longer or a year   High cholesterol    History of kidney stones     Assessment/Plan: 3 Days Post-Op Procedure(s) (LRB): Revision left knee unicompartmental arthroplasty to total knee arthroplasty (Left) Principal Problem:   Pain due to unicompartmental arthroplasty of knee ([facility]CC) Active Problems:   Failed arthroplasty, initial encounter ([place])  Estimated body mass index is 29.42 kg/m as calculated from the following:   Height as of this encounter: 5\' 10"  (1.778 m).   Weight as of this encounter: 93 kg.  DVT Prophylaxis - Aspirin Weight-bearing as tolerated.  Transfused 2 units PRBCs yesterday and Hgb increased to 8.2. We will repeat CBC around lunch time. If stable or continuing to increase and doing well with physical therapy, hope to get him home today. Unfortunately, no HHPT agencies accepted patient so we discussed HEP unless able to find ride to [place]. Patient agreeable.  R. Jaynie Bream, PA-C Orthopedic Surgery [phone] 11/09/2022, 8:59 AM

## 2022-11-09 NOTE — Progress Notes (Signed)
Physical Therapy Treatment Patient Details Name: Charles Lara MRN: [id number] DOB: 1961/04/04 Today's Date: 11/09/2022   History of Present Illness Pt is a 62 year old male s/p Revision of left knee unicompartmental arthroplasty to total knee arthroplasty on 11/06/22.  PMHx: R TKA revision, bil UKR, multiple back surgeries, Left foot drop    PT Comments    Requested RN provide pt with his orthotics and shoes when providing pain meds earlier.  Pt able to don all but his left shoe due to pain.  Pt states he can have "someone" help with this upon d/c.  Pt ambulated in hallway and reports pain still increased but no physical assist required.  Pt used bathroom mod I before returning to bed.  Pt provided with HEP handout and verbally reviewed exercises.  Pt reports understanding.  Pt feels ready for d/c home today from mobility standpoint.    Recommendations for follow up therapy are one component of a multi-disciplinary discharge planning process, led by the attending physician.  Recommendations may be updated based on patient status, additional functional criteria and insurance authorization.  Follow Up Recommendations  Follow physician's recommendations for discharge plan and follow up therapies     Assistance Recommended at Discharge PRN  Patient can return home with the following A little help with bathing/dressing/bathroom;Help with stairs or ramp for entrance   Equipment Recommendations  None recommended by PT    Recommendations for Other Services       Precautions / Restrictions Precautions Precautions: Fall;Knee Precaution Comments: prefers to wear his shoes, Left foot/ankle custom orthotic Restrictions Other Position/Activity Restrictions: WBAT     Mobility  Bed Mobility Overal bed mobility: Modified Independent                  Transfers Overall transfer level: Needs assistance Equipment used: Rolling walker (2 wheels) Transfers: Sit to/from Stand Sit to Stand:  Supervision                Ambulation/Gait Ambulation/Gait assistance: Min guard, Supervision Gait Distance (Feet): 120 Feet Assistive device: Rolling walker (2 wheels) Gait Pattern/deviations: Step-to pattern, Decreased stance time - left, Antalgic       General Gait Details: verbal cues for sequence, RW positioning; increased reliance on UE support through RW   Stairs Stairs:  (pt declined need to practice steps again, verbally reviewed)           Wheelchair Mobility    Modified Rankin (Stroke Patients Only)       Balance                                            Cognition Arousal/Alertness: Awake/alert Behavior During Therapy: WFL for tasks assessed/performed Overall Cognitive Status: Within Functional Limits for tasks assessed                                          Exercises      General Comments        Pertinent Vitals/Pain Pain Assessment Pain Assessment: 0-10 Pain Score: 8  Pain Location: left knee and thigh Pain Descriptors / Indicators: Sore, Grimacing, Guarding, Tender, Tightness Pain Intervention(s): Premedicated before session, Repositioned, Monitored during session    Home Living  Prior Function            PT Goals (current goals can now be found in the care plan section) Progress towards PT goals: Progressing toward goals    Frequency    7X/week      PT Plan Current plan remains appropriate    Co-evaluation              AM-PAC PT "6 Clicks" Mobility   Outcome Measure  Help needed turning from your back to your side while in a flat bed without using bedrails?: A Little Help needed moving from lying on your back to sitting on the side of a flat bed without using bedrails?: A Little Help needed moving to and from a bed to a chair (including a wheelchair)?: A Little Help needed standing up from a chair using your arms (e.g., wheelchair or  bedside chair)?: A Little Help needed to walk in hospital room?: A Little Help needed climbing 3-5 steps with a railing? : A Little 6 Click Score: 18    End of Session Equipment Utilized During Treatment: Gait belt Activity Tolerance: Patient limited by pain Patient left: in bed;with call bell/phone within reach Nurse Communication: Mobility status PT Visit Diagnosis: Other abnormalities of gait and mobility (R26.89)     Time: [id number] PT Time Calculation (min) (ACUTE ONLY): 27 min  Charges:  $Gait Training: 8-22 mins $Therapeutic Exercise: 8-22 mins                    Jannette Spanner PT, DPT Physical Therapist Acute Rehabilitation Services Preferred contact method: Secure Chat Weekend Pager Only: [phone] Office: [phone]    [name] 11/09/2022, 1:34 PM

## 2022-11-10 LAB — TYPE AND SCREEN
ABO/RH(D): O NEG
Antibody Screen: NEGATIVE
Unit division: 0
Unit division: 0

## 2022-11-10 LAB — BPAM RBC
Blood Product Expiration Date: 202403312359
Blood Product Expiration Date: 202404022359
ISSUE DATE / TIME: 202403081531
ISSUE DATE / TIME: 202403082031
Unit Type and Rh: 9500
Unit Type and Rh: 9500

## 2022-12-05 DIAGNOSIS — M25519 Pain in unspecified shoulder: Secondary | ICD-10-CM | POA: Diagnosis not present

## 2022-12-05 DIAGNOSIS — Z79899 Other long term (current) drug therapy: Secondary | ICD-10-CM | POA: Diagnosis not present

## 2022-12-05 DIAGNOSIS — M25569 Pain in unspecified knee: Secondary | ICD-10-CM | POA: Diagnosis not present

## 2022-12-05 DIAGNOSIS — I10 Essential (primary) hypertension: Secondary | ICD-10-CM | POA: Diagnosis not present

## 2022-12-09 DIAGNOSIS — M25562 Pain in left knee: Secondary | ICD-10-CM | POA: Diagnosis not present

## 2022-12-09 DIAGNOSIS — Z79899 Other long term (current) drug therapy: Secondary | ICD-10-CM | POA: Diagnosis not present

## 2022-12-16 DIAGNOSIS — M79672 Pain in left foot: Secondary | ICD-10-CM | POA: Diagnosis not present

## 2022-12-16 DIAGNOSIS — M216X2 Other acquired deformities of left foot: Secondary | ICD-10-CM | POA: Diagnosis not present

## 2022-12-16 DIAGNOSIS — F17200 Nicotine dependence, unspecified, uncomplicated: Secondary | ICD-10-CM | POA: Diagnosis not present

## 2022-12-18 DIAGNOSIS — M25562 Pain in left knee: Secondary | ICD-10-CM | POA: Diagnosis not present

## 2022-12-25 DIAGNOSIS — M25562 Pain in left knee: Secondary | ICD-10-CM | POA: Diagnosis not present

## 2023-01-01 DIAGNOSIS — M25562 Pain in left knee: Secondary | ICD-10-CM | POA: Diagnosis not present

## 2023-01-03 DIAGNOSIS — Z79899 Other long term (current) drug therapy: Secondary | ICD-10-CM | POA: Diagnosis not present

## 2023-01-03 DIAGNOSIS — I10 Essential (primary) hypertension: Secondary | ICD-10-CM | POA: Diagnosis not present

## 2023-01-03 DIAGNOSIS — M25519 Pain in unspecified shoulder: Secondary | ICD-10-CM | POA: Diagnosis not present

## 2023-01-03 DIAGNOSIS — M25569 Pain in unspecified knee: Secondary | ICD-10-CM | POA: Diagnosis not present

## 2023-01-07 DIAGNOSIS — Z79899 Other long term (current) drug therapy: Secondary | ICD-10-CM | POA: Diagnosis not present

## 2023-01-14 DIAGNOSIS — I743 Embolism and thrombosis of arteries of the lower extremities: Secondary | ICD-10-CM | POA: Diagnosis not present

## 2023-01-14 DIAGNOSIS — M4014 Other secondary kyphosis, thoracic region: Secondary | ICD-10-CM | POA: Diagnosis not present

## 2023-01-14 DIAGNOSIS — Z981 Arthrodesis status: Secondary | ICD-10-CM | POA: Diagnosis not present

## 2023-01-14 DIAGNOSIS — T84498A Other mechanical complication of other internal orthopedic devices, implants and grafts, initial encounter: Secondary | ICD-10-CM | POA: Diagnosis not present

## 2023-01-14 DIAGNOSIS — I723 Aneurysm of iliac artery: Secondary | ICD-10-CM | POA: Diagnosis not present

## 2023-01-16 DIAGNOSIS — M25519 Pain in unspecified shoulder: Secondary | ICD-10-CM | POA: Diagnosis not present

## 2023-01-16 DIAGNOSIS — Z79899 Other long term (current) drug therapy: Secondary | ICD-10-CM | POA: Diagnosis not present

## 2023-01-16 DIAGNOSIS — I10 Essential (primary) hypertension: Secondary | ICD-10-CM | POA: Diagnosis not present

## 2023-01-16 DIAGNOSIS — M25569 Pain in unspecified knee: Secondary | ICD-10-CM | POA: Diagnosis not present

## 2023-01-21 DIAGNOSIS — Z79899 Other long term (current) drug therapy: Secondary | ICD-10-CM | POA: Diagnosis not present

## 2023-02-10 DIAGNOSIS — H2512 Age-related nuclear cataract, left eye: Secondary | ICD-10-CM | POA: Diagnosis not present

## 2023-02-10 DIAGNOSIS — H35371 Puckering of macula, right eye: Secondary | ICD-10-CM | POA: Diagnosis not present

## 2023-02-13 DIAGNOSIS — M25569 Pain in unspecified knee: Secondary | ICD-10-CM | POA: Diagnosis not present

## 2023-02-13 DIAGNOSIS — M25519 Pain in unspecified shoulder: Secondary | ICD-10-CM | POA: Diagnosis not present

## 2023-02-13 DIAGNOSIS — Z79899 Other long term (current) drug therapy: Secondary | ICD-10-CM | POA: Diagnosis not present

## 2023-02-19 DIAGNOSIS — Z79899 Other long term (current) drug therapy: Secondary | ICD-10-CM | POA: Diagnosis not present

## 2023-03-13 DIAGNOSIS — M25569 Pain in unspecified knee: Secondary | ICD-10-CM | POA: Diagnosis not present

## 2023-03-13 DIAGNOSIS — Z79899 Other long term (current) drug therapy: Secondary | ICD-10-CM | POA: Diagnosis not present

## 2023-03-18 DIAGNOSIS — Z79899 Other long term (current) drug therapy: Secondary | ICD-10-CM | POA: Diagnosis not present

## 2023-04-10 DIAGNOSIS — M25569 Pain in unspecified knee: Secondary | ICD-10-CM | POA: Diagnosis not present

## 2023-04-10 DIAGNOSIS — Z79899 Other long term (current) drug therapy: Secondary | ICD-10-CM | POA: Diagnosis not present

## 2023-04-10 DIAGNOSIS — M25519 Pain in unspecified shoulder: Secondary | ICD-10-CM | POA: Diagnosis not present

## 2023-05-16 DIAGNOSIS — Z79899 Other long term (current) drug therapy: Secondary | ICD-10-CM | POA: Diagnosis not present

## 2023-05-16 DIAGNOSIS — R030 Elevated blood-pressure reading, without diagnosis of hypertension: Secondary | ICD-10-CM | POA: Diagnosis not present

## 2023-05-16 DIAGNOSIS — M25519 Pain in unspecified shoulder: Secondary | ICD-10-CM | POA: Diagnosis not present

## 2023-05-16 DIAGNOSIS — M25569 Pain in unspecified knee: Secondary | ICD-10-CM | POA: Diagnosis not present

## 2023-05-20 DIAGNOSIS — Z79899 Other long term (current) drug therapy: Secondary | ICD-10-CM | POA: Diagnosis not present

## 2023-06-16 DIAGNOSIS — M25569 Pain in unspecified knee: Secondary | ICD-10-CM | POA: Diagnosis not present

## 2023-06-16 DIAGNOSIS — M25519 Pain in unspecified shoulder: Secondary | ICD-10-CM | POA: Diagnosis not present

## 2023-06-16 DIAGNOSIS — R030 Elevated blood-pressure reading, without diagnosis of hypertension: Secondary | ICD-10-CM | POA: Diagnosis not present

## 2023-06-16 DIAGNOSIS — Z79899 Other long term (current) drug therapy: Secondary | ICD-10-CM | POA: Diagnosis not present

## 2023-06-20 DIAGNOSIS — Z79899 Other long term (current) drug therapy: Secondary | ICD-10-CM | POA: Diagnosis not present

## 2023-07-15 DIAGNOSIS — Z79899 Other long term (current) drug therapy: Secondary | ICD-10-CM | POA: Diagnosis not present

## 2023-07-15 DIAGNOSIS — R030 Elevated blood-pressure reading, without diagnosis of hypertension: Secondary | ICD-10-CM | POA: Diagnosis not present

## 2023-07-15 DIAGNOSIS — M25519 Pain in unspecified shoulder: Secondary | ICD-10-CM | POA: Diagnosis not present

## 2023-07-15 DIAGNOSIS — M25569 Pain in unspecified knee: Secondary | ICD-10-CM | POA: Diagnosis not present

## 2023-07-17 DIAGNOSIS — Z79899 Other long term (current) drug therapy: Secondary | ICD-10-CM | POA: Diagnosis not present

## 2023-07-18 DIAGNOSIS — Z13220 Encounter for screening for lipoid disorders: Secondary | ICD-10-CM | POA: Diagnosis not present

## 2023-07-18 DIAGNOSIS — D509 Iron deficiency anemia, unspecified: Secondary | ICD-10-CM | POA: Diagnosis not present

## 2023-07-18 DIAGNOSIS — H9192 Unspecified hearing loss, left ear: Secondary | ICD-10-CM | POA: Diagnosis not present

## 2023-07-18 DIAGNOSIS — Z96653 Presence of artificial knee joint, bilateral: Secondary | ICD-10-CM | POA: Diagnosis not present

## 2023-07-18 DIAGNOSIS — I10 Essential (primary) hypertension: Secondary | ICD-10-CM | POA: Diagnosis not present

## 2023-07-18 DIAGNOSIS — N200 Calculus of kidney: Secondary | ICD-10-CM | POA: Diagnosis not present

## 2023-07-18 DIAGNOSIS — R7989 Other specified abnormal findings of blood chemistry: Secondary | ICD-10-CM | POA: Diagnosis not present

## 2023-07-18 DIAGNOSIS — M19012 Primary osteoarthritis, left shoulder: Secondary | ICD-10-CM | POA: Diagnosis not present

## 2023-07-18 DIAGNOSIS — N1832 Chronic kidney disease, stage 3b: Secondary | ICD-10-CM | POA: Diagnosis not present

## 2023-07-18 DIAGNOSIS — E785 Hyperlipidemia, unspecified: Secondary | ICD-10-CM | POA: Diagnosis not present

## 2023-07-18 DIAGNOSIS — Z23 Encounter for immunization: Secondary | ICD-10-CM | POA: Diagnosis not present

## 2023-08-13 DIAGNOSIS — N200 Calculus of kidney: Secondary | ICD-10-CM | POA: Diagnosis not present

## 2023-08-14 DIAGNOSIS — F17210 Nicotine dependence, cigarettes, uncomplicated: Secondary | ICD-10-CM | POA: Diagnosis not present

## 2023-08-14 DIAGNOSIS — M25569 Pain in unspecified knee: Secondary | ICD-10-CM | POA: Diagnosis not present

## 2023-08-14 DIAGNOSIS — Z79899 Other long term (current) drug therapy: Secondary | ICD-10-CM | POA: Diagnosis not present

## 2023-08-14 DIAGNOSIS — M25519 Pain in unspecified shoulder: Secondary | ICD-10-CM | POA: Diagnosis not present

## 2023-08-16 DIAGNOSIS — Z79899 Other long term (current) drug therapy: Secondary | ICD-10-CM | POA: Diagnosis not present

## 2023-08-21 DIAGNOSIS — H9192 Unspecified hearing loss, left ear: Secondary | ICD-10-CM | POA: Diagnosis not present

## 2023-08-25 DIAGNOSIS — H918X3 Other specified hearing loss, bilateral: Secondary | ICD-10-CM | POA: Diagnosis not present

## 2023-08-25 DIAGNOSIS — R0689 Other abnormalities of breathing: Secondary | ICD-10-CM | POA: Diagnosis not present

## 2023-08-29 DIAGNOSIS — M19012 Primary osteoarthritis, left shoulder: Secondary | ICD-10-CM | POA: Diagnosis not present

## 2023-09-11 DIAGNOSIS — Q6672 Congenital pes cavus, left foot: Secondary | ICD-10-CM | POA: Diagnosis not present

## 2023-09-11 DIAGNOSIS — M19072 Primary osteoarthritis, left ankle and foot: Secondary | ICD-10-CM | POA: Diagnosis not present

## 2023-09-11 DIAGNOSIS — S99922A Unspecified injury of left foot, initial encounter: Secondary | ICD-10-CM | POA: Diagnosis not present

## 2023-09-11 DIAGNOSIS — M79672 Pain in left foot: Secondary | ICD-10-CM | POA: Diagnosis not present

## 2023-09-15 DIAGNOSIS — F17210 Nicotine dependence, cigarettes, uncomplicated: Secondary | ICD-10-CM | POA: Diagnosis not present

## 2023-09-15 DIAGNOSIS — I10 Essential (primary) hypertension: Secondary | ICD-10-CM | POA: Diagnosis not present

## 2023-09-15 DIAGNOSIS — M25569 Pain in unspecified knee: Secondary | ICD-10-CM | POA: Diagnosis not present

## 2023-09-15 DIAGNOSIS — M25519 Pain in unspecified shoulder: Secondary | ICD-10-CM | POA: Diagnosis not present

## 2023-09-15 DIAGNOSIS — Z79899 Other long term (current) drug therapy: Secondary | ICD-10-CM | POA: Diagnosis not present

## 2023-09-18 DIAGNOSIS — Z79899 Other long term (current) drug therapy: Secondary | ICD-10-CM | POA: Diagnosis not present

## 2023-09-19 DIAGNOSIS — R0689 Other abnormalities of breathing: Secondary | ICD-10-CM | POA: Diagnosis not present

## 2023-09-19 DIAGNOSIS — J342 Deviated nasal septum: Secondary | ICD-10-CM | POA: Diagnosis not present

## 2023-09-19 DIAGNOSIS — J310 Chronic rhinitis: Secondary | ICD-10-CM | POA: Diagnosis not present

## 2023-10-13 DIAGNOSIS — M25519 Pain in unspecified shoulder: Secondary | ICD-10-CM | POA: Diagnosis not present

## 2023-10-13 DIAGNOSIS — Z0000 Encounter for general adult medical examination without abnormal findings: Secondary | ICD-10-CM | POA: Diagnosis not present

## 2023-10-13 DIAGNOSIS — F17210 Nicotine dependence, cigarettes, uncomplicated: Secondary | ICD-10-CM | POA: Diagnosis not present

## 2023-10-13 DIAGNOSIS — Z79899 Other long term (current) drug therapy: Secondary | ICD-10-CM | POA: Diagnosis not present

## 2023-10-13 DIAGNOSIS — M25569 Pain in unspecified knee: Secondary | ICD-10-CM | POA: Diagnosis not present

## 2023-10-15 DIAGNOSIS — Z79899 Other long term (current) drug therapy: Secondary | ICD-10-CM | POA: Diagnosis not present
